# Patient Record
Sex: Female | Born: 1950 | Race: Black or African American | Hispanic: No | Marital: Married | Smoking: Former smoker
Health system: Southern US, Community
[De-identification: ages and names within clinical notes are randomized; demographics above are authoritative.]

## PROBLEM LIST (undated history)

## (undated) DIAGNOSIS — K219 Gastro-esophageal reflux disease without esophagitis: Secondary | ICD-10-CM

## (undated) DIAGNOSIS — E785 Hyperlipidemia, unspecified: Secondary | ICD-10-CM

## (undated) DIAGNOSIS — M069 Rheumatoid arthritis, unspecified: Secondary | ICD-10-CM

## (undated) DIAGNOSIS — E1169 Type 2 diabetes mellitus with other specified complication: Secondary | ICD-10-CM

## (undated) DIAGNOSIS — Z972 Presence of dental prosthetic device (complete) (partial): Secondary | ICD-10-CM

## (undated) DIAGNOSIS — I10 Essential (primary) hypertension: Secondary | ICD-10-CM

## (undated) HISTORY — PX: COLONOSCOPY: SHX174

---

## 2007-01-15 ENCOUNTER — Ambulatory Visit: Payer: Self-pay | Admitting: Family Medicine

## 2007-07-28 ENCOUNTER — Ambulatory Visit: Payer: Self-pay | Admitting: Gastroenterology

## 2008-06-30 ENCOUNTER — Ambulatory Visit: Payer: Self-pay | Admitting: Family Medicine

## 2008-07-20 ENCOUNTER — Ambulatory Visit: Payer: Self-pay | Admitting: Family Medicine

## 2009-07-21 ENCOUNTER — Ambulatory Visit: Payer: Self-pay | Admitting: Family Medicine

## 2010-07-26 ENCOUNTER — Ambulatory Visit: Payer: Self-pay | Admitting: Unknown Physician Specialty

## 2011-08-21 ENCOUNTER — Ambulatory Visit: Payer: Self-pay | Admitting: Unknown Physician Specialty

## 2012-03-04 ENCOUNTER — Other Ambulatory Visit: Payer: Self-pay | Admitting: Cardiology

## 2012-03-04 DIAGNOSIS — R0602 Shortness of breath: Secondary | ICD-10-CM

## 2012-03-05 ENCOUNTER — Other Ambulatory Visit: Payer: Self-pay

## 2012-03-05 ENCOUNTER — Other Ambulatory Visit (INDEPENDENT_AMBULATORY_CARE_PROVIDER_SITE_OTHER): Payer: BC Managed Care – PPO

## 2012-03-05 DIAGNOSIS — R0602 Shortness of breath: Secondary | ICD-10-CM

## 2012-03-10 ENCOUNTER — Encounter (HOSPITAL_COMMUNITY): Payer: Self-pay | Admitting: Internal Medicine

## 2012-11-17 ENCOUNTER — Ambulatory Visit: Payer: Self-pay | Admitting: Gastroenterology

## 2012-12-23 ENCOUNTER — Ambulatory Visit: Payer: Self-pay | Admitting: Internal Medicine

## 2013-06-18 IMAGING — MG MM CAD SCREENING MAMMO
1 series · 4 of 4 positions shown · non-contrast
Comparison: none

REASON FOR EXAM: SCR MAMMO NO ORDER
COMMENTS:

PROCEDURE:     MAM - MAM DGTL SCRN MAM NO ORDER W/CAD  - December 23, 2012  [DATE]
RESULT:     COMPARISON:  08/21/2011, 07/26/2010, 07/21/2009
TECHNIQUE: Digital screening mammograms were obtained. FDA approved
computer-aided detection (CAD) for mammography was utilized for this study.
BREAST COMPOSITION: The breast composition is SCATTERED FIBROGLANDULAR
TISSUE (glandular tissue is  25-50%)

[R CC · right · 4 of 4 slices shown]
[im 1/4]
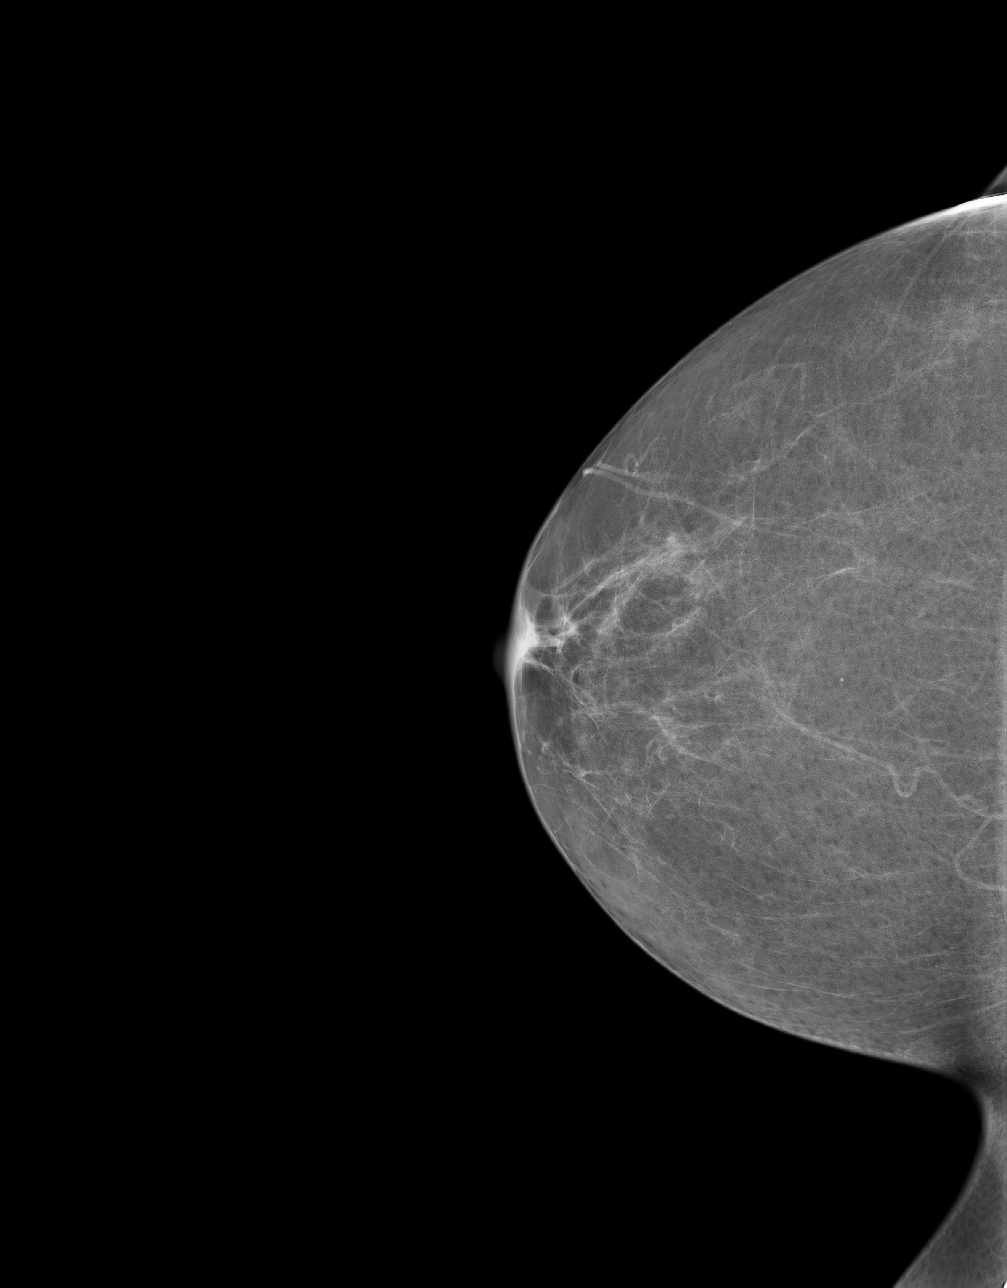
[im 2/4]
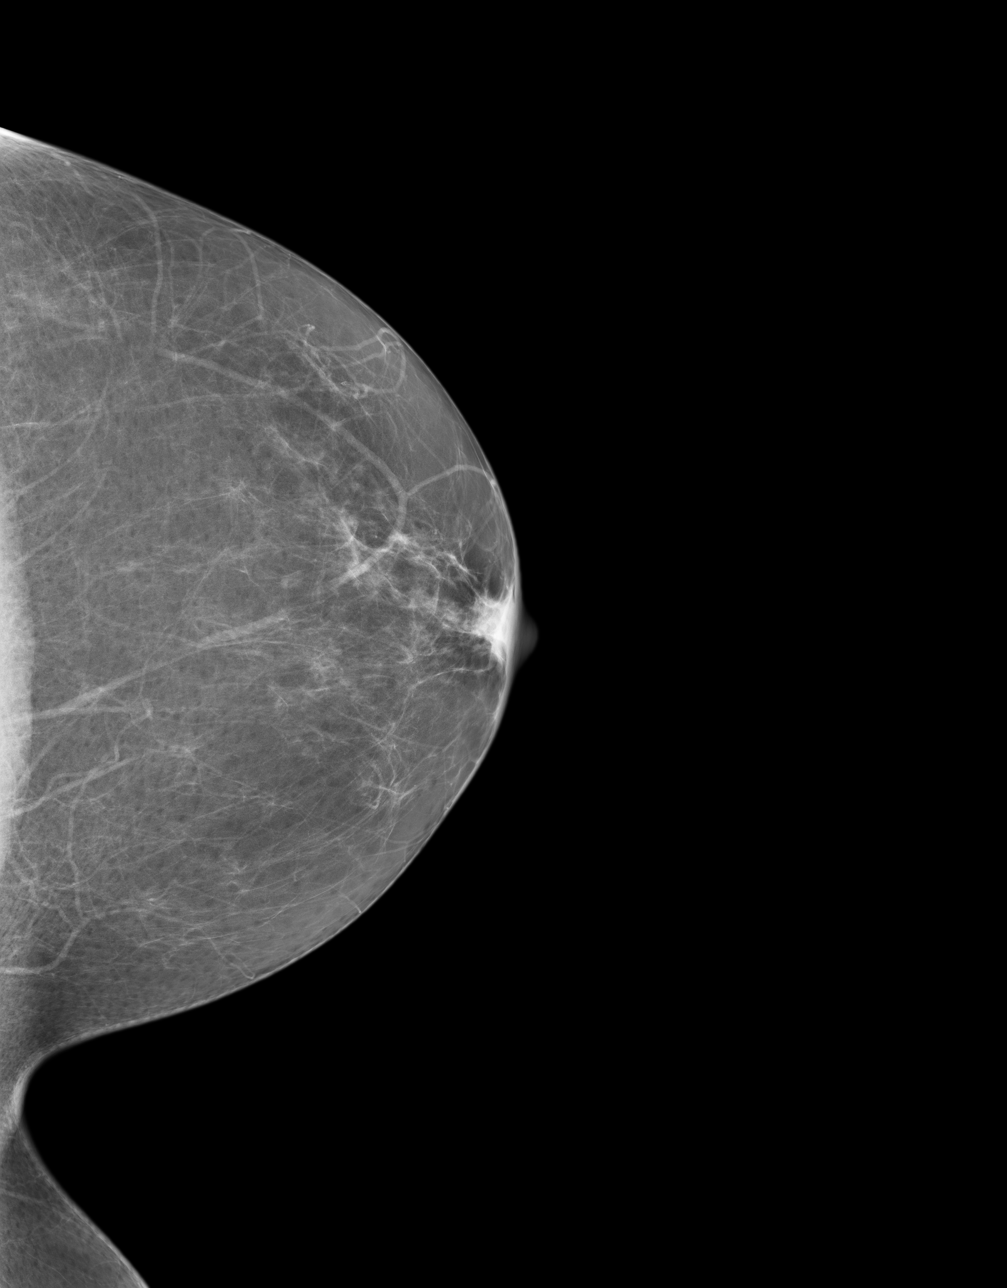
[im 3/4]
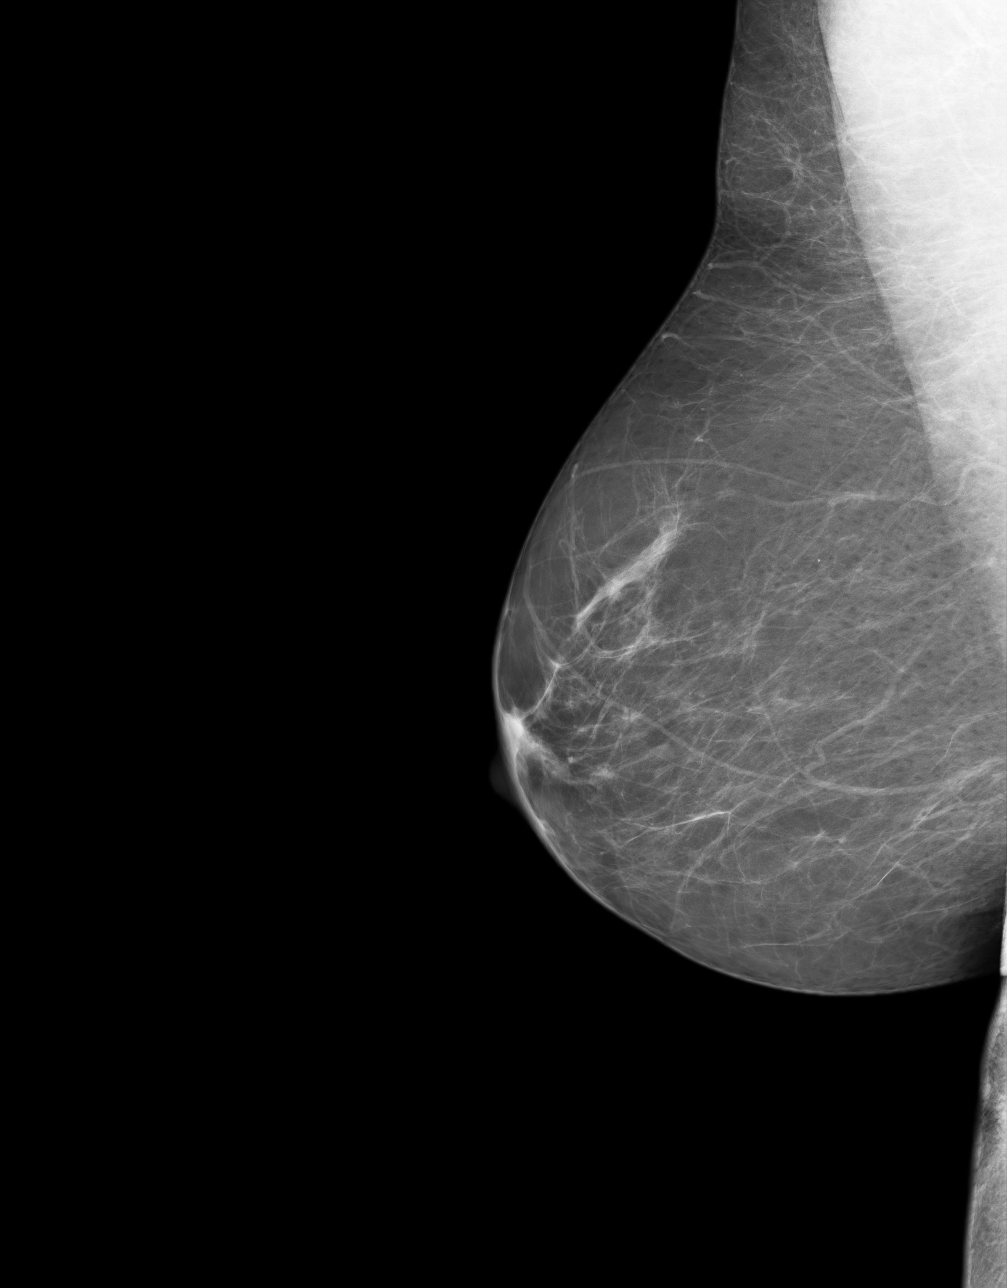
[im 4/4]
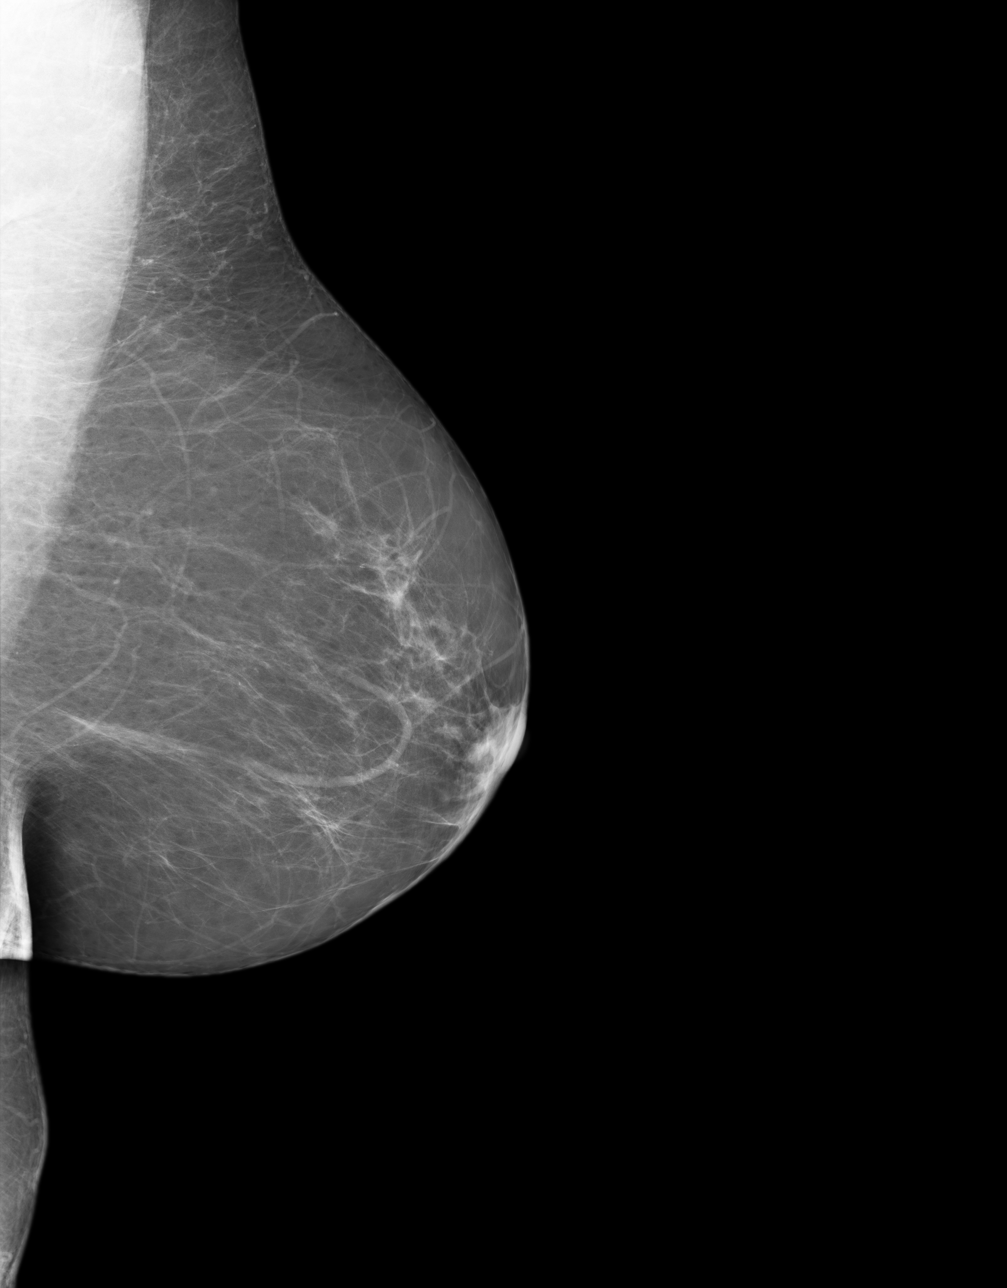

[4 of 4 positions shown; findings below may reference images not displayed]

FINDING: There is no dominant mass, architectural distortion or clusters of
suspicious microcalcifications.
IMPRESSION: 1.     Stable bilateral mammogram.
2.     Annual mammographic follow up recommended.

BI-RADS:  Category 1- Negative

A negative mammogram report does not preclude biopsy or other evaluation of
a clinically palpable or otherwise suspicious mass or lesion. Breast cancer
may not be detected by mammography in up to 10% of cases.

[REDACTED]

## 2016-12-17 ENCOUNTER — Other Ambulatory Visit: Payer: Self-pay | Admitting: Internal Medicine

## 2016-12-17 DIAGNOSIS — Z1239 Encounter for other screening for malignant neoplasm of breast: Secondary | ICD-10-CM

## 2017-01-04 ENCOUNTER — Ambulatory Visit
Admission: RE | Admit: 2017-01-04 | Discharge: 2017-01-04 | Disposition: A | Discharge status: Home / Self Care | Payer: Medicare Other | Source: Ambulatory Visit | Attending: Internal Medicine | Admitting: Internal Medicine

## 2017-01-04 ENCOUNTER — Encounter: Payer: Self-pay | Admitting: Radiology

## 2017-01-04 DIAGNOSIS — Z1231 Encounter for screening mammogram for malignant neoplasm of breast: Secondary | ICD-10-CM | POA: Insufficient documentation

## 2017-01-04 DIAGNOSIS — Z1239 Encounter for other screening for malignant neoplasm of breast: Secondary | ICD-10-CM

## 2017-04-19 ENCOUNTER — Other Ambulatory Visit: Payer: Self-pay | Admitting: Internal Medicine

## 2017-04-19 DIAGNOSIS — E049 Nontoxic goiter, unspecified: Secondary | ICD-10-CM

## 2017-04-26 ENCOUNTER — Ambulatory Visit: Payer: Medicare Other

## 2017-11-07 ENCOUNTER — Other Ambulatory Visit: Payer: Self-pay | Admitting: Internal Medicine

## 2017-11-07 DIAGNOSIS — Z1231 Encounter for screening mammogram for malignant neoplasm of breast: Secondary | ICD-10-CM

## 2018-01-07 ENCOUNTER — Ambulatory Visit
Admission: RE | Admit: 2018-01-07 | Discharge: 2018-01-07 | Disposition: A | Discharge status: Home / Self Care | Payer: Medicare Other | Source: Ambulatory Visit | Attending: Internal Medicine | Admitting: Internal Medicine

## 2018-01-07 DIAGNOSIS — Z1231 Encounter for screening mammogram for malignant neoplasm of breast: Secondary | ICD-10-CM | POA: Diagnosis present

## 2019-01-22 ENCOUNTER — Other Ambulatory Visit: Payer: Self-pay | Admitting: Family

## 2019-01-22 DIAGNOSIS — Z1231 Encounter for screening mammogram for malignant neoplasm of breast: Secondary | ICD-10-CM

## 2019-02-04 ENCOUNTER — Ambulatory Visit
Admission: RE | Admit: 2019-02-04 | Discharge: 2019-02-04 | Disposition: A | Discharge status: Home / Self Care | Payer: Medicare Other | Source: Ambulatory Visit | Attending: Family | Admitting: Family

## 2019-02-04 ENCOUNTER — Other Ambulatory Visit: Payer: Self-pay

## 2019-02-04 DIAGNOSIS — Z1231 Encounter for screening mammogram for malignant neoplasm of breast: Secondary | ICD-10-CM | POA: Insufficient documentation

## 2019-10-08 ENCOUNTER — Ambulatory Visit: Payer: Medicare Other | Attending: Internal Medicine

## 2019-10-08 DIAGNOSIS — Z23 Encounter for immunization: Secondary | ICD-10-CM | POA: Insufficient documentation

## 2019-10-08 NOTE — Progress Notes (Signed)
   Covid-19 Vaccination Clinic  Name:  Kathryn Mcdowell    MRN: [id number] DOB: April 06, 1951  10/08/2019  [name] was observed post Covid-19 immunization for 15 minutes without incidence. She was provided with Vaccine Information Sheet and instruction to access the V-Safe system.   [name] was instructed to call 911 with any severe reactions post vaccine: Marland Kitchen Difficulty breathing  . Swelling of your face and throat  . A fast heartbeat  . A bad rash all over your body  . Dizziness and weakness    Immunizations Administered    Name Date Dose VIS Date Route   Pfizer COVID-19 Vaccine 10/08/2019  9:03 AM 0.3 mL 07/24/2019 Intramuscular   Manufacturer: [place]   Lot: [id number]   [place]: [id number]

## 2019-11-04 ENCOUNTER — Ambulatory Visit: Payer: Medicare Other | Attending: Internal Medicine

## 2019-11-04 DIAGNOSIS — Z23 Encounter for immunization: Secondary | ICD-10-CM

## 2019-11-04 NOTE — Progress Notes (Signed)
   Covid-19 Vaccination Clinic  Name:  SHONICE CURLING    MRN: [id number] DOB: Aug 20, 1950  11/04/2019  Ms. Boler was observed post Covid-19 immunization for 15 minutes without incident. She was provided with Vaccine Information Sheet and instruction to access the V-Safe system.   Ms. Sperbeck was instructed to call 911 with any severe reactions post vaccine: Marland Kitchen Difficulty breathing  . Swelling of face and throat  . A fast heartbeat  . A bad rash all over body  . Dizziness and weakness   Immunizations Administered    Name Date Dose VIS Date Route   Pfizer COVID-19 Vaccine 11/04/2019  9:45 AM 0.3 mL 07/24/2019 Intramuscular   Manufacturer: [employer], [employer]   Lot: [id number]   [place]: [id number]

## 2020-01-07 ENCOUNTER — Other Ambulatory Visit: Payer: Self-pay | Admitting: Internal Medicine

## 2020-01-07 DIAGNOSIS — Z1231 Encounter for screening mammogram for malignant neoplasm of breast: Secondary | ICD-10-CM

## 2020-02-05 ENCOUNTER — Ambulatory Visit
Admission: RE | Admit: 2020-02-05 | Discharge: 2020-02-05 | Disposition: A | Discharge status: Home / Self Care | Payer: Medicare Other | Source: Ambulatory Visit | Attending: Internal Medicine | Admitting: Internal Medicine

## 2020-02-05 DIAGNOSIS — Z1231 Encounter for screening mammogram for malignant neoplasm of breast: Secondary | ICD-10-CM | POA: Insufficient documentation

## 2021-01-18 DIAGNOSIS — I10 Essential (primary) hypertension: Secondary | ICD-10-CM | POA: Diagnosis not present

## 2021-01-18 DIAGNOSIS — R7302 Impaired glucose tolerance (oral): Secondary | ICD-10-CM | POA: Diagnosis not present

## 2021-01-18 DIAGNOSIS — E782 Mixed hyperlipidemia: Secondary | ICD-10-CM | POA: Diagnosis not present

## 2021-01-19 ENCOUNTER — Other Ambulatory Visit: Payer: Self-pay | Admitting: Internal Medicine

## 2021-01-19 DIAGNOSIS — I10 Essential (primary) hypertension: Secondary | ICD-10-CM | POA: Diagnosis not present

## 2021-01-19 DIAGNOSIS — Z1231 Encounter for screening mammogram for malignant neoplasm of breast: Secondary | ICD-10-CM

## 2021-01-19 DIAGNOSIS — F32A Depression, unspecified: Secondary | ICD-10-CM | POA: Diagnosis not present

## 2021-01-19 DIAGNOSIS — R7302 Impaired glucose tolerance (oral): Secondary | ICD-10-CM | POA: Diagnosis not present

## 2021-01-19 DIAGNOSIS — E782 Mixed hyperlipidemia: Secondary | ICD-10-CM | POA: Diagnosis not present

## 2021-01-19 DIAGNOSIS — M8589 Other specified disorders of bone density and structure, multiple sites: Secondary | ICD-10-CM | POA: Diagnosis not present

## 2021-01-31 DIAGNOSIS — R29890 Loss of height: Secondary | ICD-10-CM | POA: Diagnosis not present

## 2021-02-06 ENCOUNTER — Other Ambulatory Visit: Payer: Self-pay

## 2021-02-06 ENCOUNTER — Ambulatory Visit
Admission: RE | Admit: 2021-02-06 | Discharge: 2021-02-06 | Disposition: A | Discharge status: Home / Self Care | Payer: Medicare Other | Source: Ambulatory Visit | Attending: Internal Medicine | Admitting: Internal Medicine

## 2021-02-06 DIAGNOSIS — Z1231 Encounter for screening mammogram for malignant neoplasm of breast: Secondary | ICD-10-CM | POA: Insufficient documentation

## 2021-02-09 ENCOUNTER — Other Ambulatory Visit: Payer: Self-pay

## 2021-02-09 ENCOUNTER — Emergency Department: Payer: Medicare Other

## 2021-02-09 ENCOUNTER — Encounter: Payer: Self-pay | Admitting: Emergency Medicine

## 2021-02-09 ENCOUNTER — Inpatient Hospital Stay
Admission: EM | Admit: 2021-02-09 | Discharge: 2021-02-11 | DRG: 546 | Disposition: A | Discharge status: Home / Self Care | Payer: Medicare Other | Attending: Family Medicine | Admitting: Family Medicine

## 2021-02-09 DIAGNOSIS — I700 Atherosclerosis of aorta: Secondary | ICD-10-CM | POA: Diagnosis not present

## 2021-02-09 DIAGNOSIS — Z87891 Personal history of nicotine dependence: Secondary | ICD-10-CM

## 2021-02-09 DIAGNOSIS — R6510 Systemic inflammatory response syndrome (SIRS) of non-infectious origin without acute organ dysfunction: Secondary | ICD-10-CM | POA: Diagnosis present

## 2021-02-09 DIAGNOSIS — I253 Aneurysm of heart: Secondary | ICD-10-CM | POA: Diagnosis present

## 2021-02-09 DIAGNOSIS — I422 Other hypertrophic cardiomyopathy: Secondary | ICD-10-CM | POA: Diagnosis not present

## 2021-02-09 DIAGNOSIS — Z7982 Long term (current) use of aspirin: Secondary | ICD-10-CM

## 2021-02-09 DIAGNOSIS — M25511 Pain in right shoulder: Secondary | ICD-10-CM | POA: Diagnosis present

## 2021-02-09 DIAGNOSIS — I421 Obstructive hypertrophic cardiomyopathy: Secondary | ICD-10-CM | POA: Diagnosis not present

## 2021-02-09 DIAGNOSIS — Z20822 Contact with and (suspected) exposure to covid-19: Secondary | ICD-10-CM | POA: Diagnosis not present

## 2021-02-09 DIAGNOSIS — R0682 Tachypnea, not elsewhere classified: Secondary | ICD-10-CM | POA: Diagnosis not present

## 2021-02-09 DIAGNOSIS — A419 Sepsis, unspecified organism: Secondary | ICD-10-CM | POA: Diagnosis not present

## 2021-02-09 DIAGNOSIS — R809 Proteinuria, unspecified: Secondary | ICD-10-CM | POA: Diagnosis not present

## 2021-02-09 DIAGNOSIS — R509 Fever, unspecified: Secondary | ICD-10-CM | POA: Diagnosis present

## 2021-02-09 DIAGNOSIS — M25519 Pain in unspecified shoulder: Secondary | ICD-10-CM

## 2021-02-09 DIAGNOSIS — R000 Tachycardia, unspecified: Secondary | ICD-10-CM | POA: Diagnosis present

## 2021-02-09 DIAGNOSIS — R3129 Other microscopic hematuria: Secondary | ICD-10-CM | POA: Diagnosis not present

## 2021-02-09 DIAGNOSIS — M353 Polymyalgia rheumatica: Secondary | ICD-10-CM | POA: Diagnosis not present

## 2021-02-09 DIAGNOSIS — M069 Rheumatoid arthritis, unspecified: Secondary | ICD-10-CM | POA: Diagnosis not present

## 2021-02-09 DIAGNOSIS — M0580 Other rheumatoid arthritis with rheumatoid factor of unspecified site: Secondary | ICD-10-CM | POA: Diagnosis not present

## 2021-02-09 DIAGNOSIS — R9431 Abnormal electrocardiogram [ECG] [EKG]: Secondary | ICD-10-CM | POA: Diagnosis not present

## 2021-02-09 DIAGNOSIS — Z79899 Other long term (current) drug therapy: Secondary | ICD-10-CM

## 2021-02-09 DIAGNOSIS — M25512 Pain in left shoulder: Secondary | ICD-10-CM | POA: Diagnosis present

## 2021-02-09 DIAGNOSIS — E049 Nontoxic goiter, unspecified: Secondary | ICD-10-CM | POA: Diagnosis present

## 2021-02-09 DIAGNOSIS — E785 Hyperlipidemia, unspecified: Secondary | ICD-10-CM | POA: Diagnosis not present

## 2021-02-09 DIAGNOSIS — I10 Essential (primary) hypertension: Secondary | ICD-10-CM | POA: Diagnosis not present

## 2021-02-09 DIAGNOSIS — D72829 Elevated white blood cell count, unspecified: Secondary | ICD-10-CM | POA: Diagnosis present

## 2021-02-09 DIAGNOSIS — E782 Mixed hyperlipidemia: Secondary | ICD-10-CM

## 2021-02-09 HISTORY — DX: Essential (primary) hypertension: I10

## 2021-02-09 HISTORY — DX: Hyperlipidemia, unspecified: E78.5

## 2021-02-09 LAB — COMPREHENSIVE METABOLIC PANEL
ALT: 38 U/L (ref 0–44)
AST: 43 U/L — ABNORMAL HIGH (ref 15–41)
Albumin: 4.2 g/dL (ref 3.5–5.0)
Alkaline Phosphatase: 55 U/L (ref 38–126)
Anion gap: 12 (ref 5–15)
BUN: 9 mg/dL (ref 8–23)
CO2: 21 mmol/L — ABNORMAL LOW (ref 22–32)
Calcium: 9.3 mg/dL (ref 8.9–10.3)
Chloride: 93 mmol/L — ABNORMAL LOW (ref 98–111)
Creatinine, Ser: 0.74 mg/dL (ref 0.44–1.00)
GFR, Estimated: >60 mL/min (ref >60)
Glucose, Bld: 175 mg/dL — ABNORMAL HIGH (ref 70–99)
Potassium: 3.4 mmol/L — ABNORMAL LOW (ref 3.5–5.1)
Sodium: 126 mmol/L — ABNORMAL LOW (ref 135–145)
Total Bilirubin: 1.3 mg/dL — ABNORMAL HIGH (ref 0.3–1.2)
Total Protein: 8.4 g/dL — ABNORMAL HIGH (ref 6.5–8.1)

## 2021-02-09 LAB — URINALYSIS, COMPLETE (UACMP) WITH MICROSCOPIC
Bilirubin Urine: NEGATIVE
Glucose, UA: NEGATIVE mg/dL
Ketones, ur: NEGATIVE mg/dL
Leukocytes,Ua: NEGATIVE
Nitrite: NEGATIVE
Protein, ur: 100 mg/dL — AB
Specific Gravity, Urine: 1.004 — ABNORMAL LOW (ref 1.005–1.030)
Squamous Epithelial / HPF: NONE SEEN (ref 0–5)
pH: 6 (ref 5.0–8.0)

## 2021-02-09 LAB — CBC WITH DIFFERENTIAL/PLATELET
Abs Immature Granulocytes: 0.14 10*3/uL — ABNORMAL HIGH (ref 0.00–0.07)
Basophils Absolute: 0 10*3/uL (ref 0.0–0.1)
Basophils Relative: 0 %
Eosinophils Absolute: 0 10*3/uL (ref 0.0–0.5)
Eosinophils Relative: 0 %
HCT: 32.3 % — ABNORMAL LOW (ref 36.0–46.0)
Hemoglobin: 11.7 g/dL — ABNORMAL LOW (ref 12.0–15.0)
Immature Granulocytes: 1 %
Lymphocytes Relative: 5 %
Lymphs Abs: 1 10*3/uL (ref 0.7–4.0)
MCH: 33.6 pg (ref 26.0–34.0)
MCHC: 36.2 g/dL — ABNORMAL HIGH (ref 30.0–36.0)
MCV: 92.8 fL (ref 80.0–100.0)
Monocytes Absolute: 1.9 10*3/uL — ABNORMAL HIGH (ref 0.1–1.0)
Monocytes Relative: 9 %
Neutro Abs: 18.7 10*3/uL — ABNORMAL HIGH (ref 1.7–7.7)
Neutrophils Relative %: 85 %
Platelets: 247 10*3/uL (ref 150–400)
RBC: 3.48 MIL/uL — ABNORMAL LOW (ref 3.87–5.11)
RDW: 13 % (ref 11.5–15.5)
WBC: 21.7 10*3/uL — ABNORMAL HIGH (ref 4.0–10.5)
nRBC: 0 % (ref 0.0–0.2)

## 2021-02-09 LAB — TROPONIN I (HIGH SENSITIVITY)
Troponin I (High Sensitivity): 11 ng/L (ref <18)
Troponin I (High Sensitivity): 13 ng/L (ref <18)

## 2021-02-09 LAB — T4, FREE: Free T4: 0.96 ng/dL (ref 0.61–1.12)

## 2021-02-09 LAB — TSH: TSH: 0.439 u[IU]/mL (ref 0.350–4.500)

## 2021-02-09 LAB — PROCALCITONIN: Procalcitonin: 0.21 ng/mL

## 2021-02-09 LAB — RESP PANEL BY RT-PCR (FLU A&B, COVID) ARPGX2
Influenza A by PCR: NEGATIVE
Influenza B by PCR: NEGATIVE
SARS Coronavirus 2 by RT PCR: NEGATIVE

## 2021-02-09 LAB — SEDIMENTATION RATE: Sed Rate: 70 mm/hr — ABNORMAL HIGH (ref 0–30)

## 2021-02-09 LAB — LACTIC ACID, PLASMA
Lactic Acid, Venous: 2.4 mmol/L (ref 0.5–1.9)
Lactic Acid, Venous: 2.3 mmol/L (ref 0.5–1.9)

## 2021-02-09 MED ORDER — SODIUM CHLORIDE 0.9 % IV BOLUS
1000.0000 mL | Freq: Once | INTRAVENOUS | Status: AC
  Administered 2021-02-09: 1000 mL via INTRAVENOUS

## 2021-02-09 MED ORDER — CEFEPIME HCL 2 G IJ SOLR
2.0000 g | Freq: Once | INTRAMUSCULAR | Status: AC
  Administered 2021-02-09: 2 g via INTRAVENOUS
  Filled 2021-02-09: qty 2

## 2021-02-09 MED ORDER — VANCOMYCIN HCL IN DEXTROSE 1-5 GM/200ML-% IV SOLN: 1000.0000 mg | Freq: Once | INTRAVENOUS | Status: DC

## 2021-02-09 MED ORDER — VANCOMYCIN HCL 1750 MG/350ML IV SOLN
1750.0000 mg | Freq: Once | INTRAVENOUS | Status: AC
  Administered 2021-02-09: 1750 mg via INTRAVENOUS
  Filled 2021-02-09: qty 350

## 2021-02-09 MED ORDER — ATORVASTATIN CALCIUM 20 MG PO TABS
20.0000 mg | ORAL_TABLET | Freq: Every day | ORAL | Status: DC
  Administered 2021-02-10: 20 mg via ORAL
  Filled 2021-02-09 (×2): qty 1

## 2021-02-09 MED ORDER — VANCOMYCIN HCL IN DEXTROSE 1-5 GM/200ML-% IV SOLN: 1000.0000 mg | INTRAVENOUS | Status: DC

## 2021-02-09 MED ORDER — METRONIDAZOLE 500 MG/100ML IV SOLN
500.0000 mg | Freq: Once | INTRAVENOUS | Status: AC
  Administered 2021-02-09: 500 mg via INTRAVENOUS
  Filled 2021-02-09 (×2): qty 100

## 2021-02-09 MED ORDER — ASPIRIN EC 81 MG PO TBEC
81.0000 mg | DELAYED_RELEASE_TABLET | Freq: Every day | ORAL | Status: DC
  Administered 2021-02-10 – 2021-02-11 (×2): 81 mg via ORAL
  Filled 2021-02-09 (×2): qty 1

## 2021-02-09 MED ORDER — ENOXAPARIN SODIUM 40 MG/0.4ML IJ SOSY
40.0000 mg | PREFILLED_SYRINGE | INTRAMUSCULAR | Status: DC
  Administered 2021-02-09 – 2021-02-10 (×2): 40 mg via SUBCUTANEOUS
  Filled 2021-02-09 (×2): qty 0.4

## 2021-02-09 MED ORDER — ACETAMINOPHEN 325 MG PO TABS
650.0000 mg | ORAL_TABLET | Freq: Once | ORAL | Status: AC | PRN
  Administered 2021-02-09: 650 mg via ORAL

## 2021-02-09 MED ORDER — ACETAMINOPHEN 325 MG PO TABS
650.0000 mg | ORAL_TABLET | Freq: Four times a day (QID) | ORAL | Status: DC | PRN
  Administered 2021-02-09 – 2021-02-11 (×3): 650 mg via ORAL
  Filled 2021-02-09 (×3): qty 2

## 2021-02-09 MED ORDER — METRONIDAZOLE 500 MG/100ML IV SOLN
500.0000 mg | Freq: Three times a day (TID) | INTRAVENOUS | Status: DC
  Administered 2021-02-10: 500 mg via INTRAVENOUS
  Filled 2021-02-09 (×2): qty 100

## 2021-02-09 MED ORDER — SODIUM CHLORIDE 0.9 % IV SOLN
2.0000 g | Freq: Three times a day (TID) | INTRAVENOUS | Status: DC
  Administered 2021-02-10 (×2): 2 g via INTRAVENOUS
  Filled 2021-02-09 (×2): qty 2

## 2021-02-09 MED ORDER — SODIUM CHLORIDE 0.9 % IV SOLN: INTRAVENOUS | Status: AC

## 2021-02-09 MED ORDER — LOSARTAN POTASSIUM 50 MG PO TABS
100.0000 mg | ORAL_TABLET | Freq: Every day | ORAL | Status: DC
  Administered 2021-02-10 – 2021-02-11 (×2): 100 mg via ORAL
  Filled 2021-02-09 (×2): qty 2

## 2021-02-09 MED ORDER — ONDANSETRON HCL 4 MG/2ML IJ SOLN: 4.0000 mg | Freq: Four times a day (QID) | INTRAMUSCULAR | Status: DC | PRN

## 2021-02-09 MED ORDER — ACETAMINOPHEN 650 MG RE SUPP: 650.0000 mg | Freq: Four times a day (QID) | RECTAL | Status: DC | PRN

## 2021-02-09 MED ORDER — AMLODIPINE BESYLATE 5 MG PO TABS
5.0000 mg | ORAL_TABLET | Freq: Every day | ORAL | Status: DC
  Administered 2021-02-10 – 2021-02-11 (×2): 5 mg via ORAL
  Filled 2021-02-09 (×2): qty 1

## 2021-02-09 MED ORDER — HYDROCHLOROTHIAZIDE 25 MG PO TABS
25.0000 mg | ORAL_TABLET | Freq: Every day | ORAL | Status: DC
  Administered 2021-02-10 – 2021-02-11 (×2): 25 mg via ORAL
  Filled 2021-02-09 (×2): qty 1

## 2021-02-09 MED ORDER — ONDANSETRON HCL 4 MG PO TABS: 4.0000 mg | ORAL_TABLET | Freq: Four times a day (QID) | ORAL | Status: DC | PRN

## 2021-02-09 MED ORDER — ACETAMINOPHEN 325 MG PO TABS
ORAL_TABLET | ORAL | Status: AC
  Administered 2021-02-09: 650 mg via ORAL
  Filled 2021-02-09: qty 2

## 2021-02-09 MED ORDER — KETOROLAC TROMETHAMINE 30 MG/ML IJ SOLN
30.0000 mg | Freq: Three times a day (TID) | INTRAMUSCULAR | Status: DC | PRN
  Administered 2021-02-10: 30 mg via INTRAVENOUS
  Filled 2021-02-09: qty 1

## 2021-02-09 MED ORDER — LIDOCAINE 5 % EX PTCH
1.0000 | MEDICATED_PATCH | CUTANEOUS | Status: DC
  Administered 2021-02-09: 1 via TRANSDERMAL
  Filled 2021-02-09 (×2): qty 1

## 2021-02-09 NOTE — Consult Note (Signed)
PHARMACY -  BRIEF ANTIBIOTIC NOTE   Pharmacy has received consult(s) for vancomycin and cefepime from an ED provider.  The patient's profile has been reviewed for ht/wt/allergies/indication/available labs.    One time order(s) placed for cefepime 2 g and vancomycin 1750 mg   Further antibiotics/pharmacy consults should be ordered by admitting physician if indicated.                       Thank you, Darnelle Bos, PharmD 02/09/2021  4:02 PM

## 2021-02-09 NOTE — ED Notes (Signed)
Pt requested to start taking lipitor tomorrow night, pt placed on hospital bed at this time.

## 2021-02-09 NOTE — Sepsis Progress Note (Signed)
Sepsis protocol followed by eLink 

## 2021-02-09 NOTE — ED Notes (Signed)
This RN agrees with triage assessment  Pt denies chest pain or any SOB during the timeframe her symptoms started.

## 2021-02-09 NOTE — ED Triage Notes (Addendum)
Pt comes into the ED via POV c/o bilateral shoulder pain that she woke up with on Tuesday.  Pt denies any known injury to the area but thinks she may have "slept wrong" on the shoulder.  Pt reports limited mobility without pain.  Pt in NAD at this time and is ambulatory to triage.  Pt denies any known h/o arthritis in the joints. Pt does present febrile and tachycardic.  Pt states she hasn't felt "well" but denies any cough, difficulty urinating, abdominal pain, etc.  Pt explains she has been very fatigue and weaker than normal.

## 2021-02-09 NOTE — H&P (Signed)
History and Physical   Kathryn Mcdowell:[id number] DOB: 1950/12/07 DOA: 02/09/2021  PCP: Perrin Maltese, MD  Outpatient Specialists: Dr. Alice Reichert Patient coming from: home via [place]  I have personally briefly reviewed patient's old medical records in [place].  Chief Concern: Bilateral shoulder pain  HPI: Kathryn Mcdowell is a 70 y.o. female with medical history significant for hypertension and hyperlipidemia presents to the emergency via POV for chief concern of bilateral shoulder pain that started on Tuesday.  She reports the pain is 10/10, pin and needles, and constant. She reports she was asleep when this happened. She reports the pain is worse with movement.  She states nothing makes it feel better.  She states she has never felt this way before.   She denies trauma to her person.  She denies bites.  Social history: lives with husband, married for 40 years. She is a former tobacco user and smoked about 1 ppd and quit 20 years ago. She drinks 2 etoh drinks per week. She denies recreational drugs use. She is retired and formerly worked as a Education officer, museum.   Vaccination history: She is vaccinated for covid 19, with 3 doses, Pfizer  ROS: Constitutional: no weight change, no fever ENT/Mouth: no sore throat, no rhinorrhea Eyes: no eye pain, no vision changes Cardiovascular: no chest pain, no dyspnea,  no edema, no palpitations Respiratory: no cough, no sputum, no wheezing Gastrointestinal: no nausea, no vomiting, no diarrhea, no constipation Genitourinary: no urinary incontinence, no dysuria, no hematuria Musculoskeletal: + arthralgias, no myalgias Skin: no skin lesions, no pruritus, Neuro: + weakness, no loss of consciousness, no syncope Psych: no anxiety, no depression, + decrease appetite Heme/Lymph: no bruising, no bleeding  ED Course: Discussed with EDP, patient requiring hospitalization due to meeting sepsis criteria.  Vitals in the emergency department was remarkable  for temperature of 100.6, respiration rate of 18, heart rate of 110, blood pressure initially 163/67.  Labs in the emergency department was remarkable for WBC 21.7, hemoglobin 11.7, platelets 247, sodium 126, chloride 93, potassium 3.4, BUN 9, serum creatinine 0.74, EGFR greater than 60, nonfasting blood glucose 175.  Lactic acid 2.4 and decreased to 2.3  Pro-Cal elevated 0.21  COVID was negative  ED provider gave patient 1 dose of Tylenol 325, normal saline 1 L bolus, lidocaine patch, and broad-spectrum antibiotics including cefepime, metronidazole, vancomycin  Assessment/Plan  Active Problems:   Sepsis ([place])   Essential hypertension   Hyperlipidemia   # Patient's meet sepsis criteria with elevated heart rate, WBC, increased lactic acid - Etiology in progress at this time - Continue broad-spectrum antibiotics - Patient did not have any dysuria or hematuria-UA was negative for nitrates and leukocytes - Blood cultures x2 in progress - Patient maintaining MAP no indication for further bolus or IVF at this time  # Bilateral shoulder joints with pain with movement - Bilateral shoulder x-ray were negative - No overt erythematous - Rheumatoid factor and CCP for AM labs ordered -Toradol 30 mg every 8 hours as needed for moderate pain and joint pain - Normal saline 125 mL/h, 8 hours, to complete 1 L bag  # Hypertension - Resumed amlodipine 5 mg daily, hydrochlorothiazide 25 mg daily, losartan 100 mg daily  # Hyperlipidemia-atorvastatin 20 mg nightly resumed  Chart reviewed.   DVT prophylaxis: Enoxaparin 40 mg subcutaneous Code Status: Full code Diet: Heart healthy Family Communication: No Disposition Plan: Pending clinical course Consults called: No Admission status: MedSurg, observation, 24 hours telemetry ordered  Past Medical History:  Diagnosis Date   Hyperlipidemia    Hypertension    History reviewed. No pertinent surgical history.  Social History:  reports that  she has never smoked. She has never used smokeless tobacco. She reports current alcohol use. No history on file for drug use.  No Known Allergies Family History  Problem Relation Age of Onset   Breast cancer Neg Hx    Family history: Family history reviewed and not pertinent  Prior to Admission medications   Not on File   Physical Exam: Vitals:   02/09/21 2030 02/09/21 2030 02/09/21 2100 02/09/21 2130  BP: 101/61  138/69 (!) 156/73  Pulse: (!) 108  (!) 105 96  Resp: (!) 24  19 (!) 26  Temp:  99.6 F (37.6 C)    TempSrc:  Oral    SpO2: 97%  94% 95%  Weight:      Height:       Constitutional: appears older than chronological age, NAD, calm, comfortable Eyes: PERRL, lids and conjunctivae normal ENMT: Mucous membranes are moist. Posterior pharynx clear of any exudate or lesions. Age-appropriate dentition. Hearing appropriate Neck: normal, supple, no masses, no thyromegaly Respiratory: clear to auscultation bilaterally, no wheezing, no crackles. Normal respiratory effort. No accessory muscle use.  Cardiovascular: Regular rate and rhythm, no murmurs / rubs / gallops. No extremity edema. 2+ pedal pulses. No carotid bruits.  Abdomen: Obese abdomen, no tenderness, no masses palpated, no hepatosplenomegaly. Bowel sounds positive.  Musculoskeletal: no clubbing / cyanosis. No joint deformity upper and lower extremities. Good ROM, no contractures, no atrophy. Normal muscle tone.  Bilateral shoulder joint tenderness and worse with movement Skin: no rashes, lesions, ulcers. No induration Neurologic: Sensation intact. Strength 5/5 in all 4.  Psychiatric: Normal judgment and insight. Alert and oriented x 3. Normal mood.   EKG: independently reviewed, showing sinus tachycardia with rate of 105, QTc 489  Chest x-ray on Admission: I personally reviewed and I agree with radiologist reading as below.  DG Chest 2 View  Result Date: 02/09/2021 CLINICAL DATA:  Bilateral shoulder pain EXAM: CHEST -  2 VIEW COMPARISON:  None. FINDINGS: Cardiac shadow is within normal limits. Aortic calcifications are seen. The lungs are clear. No bony abnormality is noted. IMPRESSION: No active cardiopulmonary disease. Electronically Signed   By: Inez Catalina M.D.   On: 02/09/2021 15:01   DG Shoulder Right  Result Date: 02/09/2021 CLINICAL DATA:  Acute bilateral shoulder pain. EXAM: RIGHT SHOULDER - 2+ VIEW COMPARISON:  None. FINDINGS: There is no evidence of fracture or dislocation. There is no evidence of arthropathy or other focal bone abnormality. Soft tissues are unremarkable. IMPRESSION: Negative. Electronically Signed   By: Marijo Conception M.D.   On: 02/09/2021 16:44   DG Shoulder Left  Result Date: 02/09/2021 CLINICAL DATA:  Acute bilateral shoulder pain. EXAM: LEFT SHOULDER - 2+ VIEW COMPARISON:  None. FINDINGS: There is no evidence of fracture or dislocation. There is no evidence of arthropathy or other focal bone abnormality. Soft tissues are unremarkable. IMPRESSION: Negative. Electronically Signed   By: Marijo Conception M.D.   On: 02/09/2021 16:43    Labs on Admission: I have personally reviewed following labs  CBC: Recent Labs  Lab 02/09/21 1422  WBC 21.7*  NEUTROABS 18.7*  HGB 11.7*  HCT 32.3*  MCV 92.8  PLT 597   Basic Metabolic Panel: Recent Labs  Lab 02/09/21 1422  NA 126*  K 3.4*  CL 93*  CO2 21*  GLUCOSE  175*  BUN 9  CREATININE 0.74  CALCIUM 9.3   GFR: Estimated Creatinine Clearance: 65.6 mL/min (by C-G formula based on SCr of 0.74 mg/dL).  Liver Function Tests: Recent Labs  Lab 02/09/21 1422  AST 43*  ALT 38  ALKPHOS 55  BILITOT 1.3*  PROT 8.4*  ALBUMIN 4.2   Thyroid Function Tests: Recent Labs    02/09/21 1422  TSH 0.439  FREET4 0.96   Dr. Tobie Poet [facility]  If 7PM-7AM, please contact overnight-coverage provider If 7AM-7PM, please contact day coverage provider [email or web address]  02/09/2021, 10:26 PM

## 2021-02-09 NOTE — ED Notes (Signed)
Lab called to assist with alb draw

## 2021-02-09 NOTE — ED Provider Notes (Signed)
[facility] Emergency Department Provider Note  ____________________________________________   Event Date/Time   First MD Initiated Contact with Patient 02/09/21 1523     (approximate)  I have reviewed the triage vital signs and the nursing notes.   HISTORY  Chief Complaint Shoulder Pain, Fever, and Weakness    HPI Kathryn Mcdowell is a 70 y.o. female with hypertension, hyperlipidemia who comes in with bilateral shoulder pain.  Patient reports worsening pain in the right shoulder and the left but still having pain in both.  She states the pain is just on the shoulder.  She is limited range of motion secondary to pain.  She is wonders if she just slept on it wrong and she pulled a muscle.  Patient was febrile and tachycardic in triage and denies realizing this.  She denies any cough, difficulties urinating, abdominal pain, rashes.  Denies any tick bites, denies any recent travel, recent surgeries.  Denies any dental issues.  Denies any metal plates in her.            Past Medical History:  Diagnosis Date   Hyperlipidemia    Hypertension     There are no problems to display for this patient.   History reviewed. No pertinent surgical history.  Prior to Admission medications   Not on File    Allergies Patient has no known allergies.  Family History  Problem Relation Age of Onset   Breast cancer Neg Hx     Social History Social History   Tobacco Use   Smoking status: Never   Smokeless tobacco: Never  Substance Use Topics   Alcohol use: Yes      Review of Systems Constitutional: Positive fever Eyes: No visual changes. ENT: No sore throat. Cardiovascular: Denies chest pain. Respiratory: Denies shortness of breath. Gastrointestinal: No abdominal pain.  No nausea, no vomiting.  No diarrhea.  No constipation. Genitourinary: Negative for dysuria. Musculoskeletal: Bilateral shoulder pain Skin: Negative for rash. Neurological:  Negative for headaches, focal weakness or numbness. All other ROS negative ____________________________________________   PHYSICAL EXAM:  VITAL SIGNS: ED Triage Vitals  Enc Vitals Group     BP 02/09/21 1407 (!) 163/67     Pulse Rate 02/09/21 1407 (!) 110     Resp 02/09/21 1407 18     Temp 02/09/21 1407 (!) 100.6 F (38.1 C)     Temp Source 02/09/21 1407 Oral     SpO2 02/09/21 1407 97 %     Weight 02/09/21 1409 172 lb (78 kg)     Height 02/09/21 1409 5\' 3"  (1.6 m)     Head Circumference --      Peak Flow --      Pain Score 02/09/21 1409 10     Pain Loc --      Pain Edu? --      Excl. in [place]? --     Constitutional: Alert and oriented. Well appearing and in no acute distress. Eyes: Conjunctivae are normal. EOMI. Head: Atraumatic. Nose: No congestion/rhinnorhea. Mouth/Throat: Mucous membranes are moist.   Neck: No stridor. Trachea Midline. FROM Cardiovascular: Normal rate, regular rhythm. Grossly normal heart sounds.  Good peripheral circulation. Respiratory: Normal respiratory effort.  No retractions. Lungs CTAB. Gastrointestinal: Soft and nontender. No distention. No abdominal bruits.  Musculoskeletal: 2+ distal pulses bilaterally without any swelling noted.  She reports tenderness in the right shoulder and the left shoulder.  Worsening tenderness with movement.  Even with passive movement patient reports pain more  on the right than the left shoulder Neurologic:  Normal speech and language. No gross focal neurologic deficits are appreciated.  Skin:  Skin is warm, dry and intact. No rash noted. Psychiatric: Mood and affect are normal. Speech and behavior are normal. GU: Deferred   ____________________________________________   LABS (all labs ordered are listed, but only abnormal results are displayed)  Labs Reviewed  LACTIC ACID, PLASMA - Abnormal; Notable for the following components:      Result Value   Lactic Acid, Venous 2.4 (*)    All other components within normal  limits  COMPREHENSIVE METABOLIC PANEL - Abnormal; Notable for the following components:   Sodium 126 (*)    Potassium 3.4 (*)    Chloride 93 (*)    CO2 21 (*)    Glucose, Bld 175 (*)    Total Protein 8.4 (*)    AST 43 (*)    Total Bilirubin 1.3 (*)    All other components within normal limits  CBC WITH DIFFERENTIAL/PLATELET - Abnormal; Notable for the following components:   WBC 21.7 (*)    RBC 3.48 (*)    Hemoglobin 11.7 (*)    HCT 32.3 (*)    MCHC 36.2 (*)    Neutro Abs 18.7 (*)    Monocytes Absolute 1.9 (*)    Abs Immature Granulocytes 0.14 (*)    All other components within normal limits  RESP PANEL BY RT-PCR (FLU A&B, COVID) ARPGX2  LACTIC ACID, PLASMA  URINALYSIS, COMPLETE (UACMP) WITH MICROSCOPIC  TSH  T4, FREE  PROCALCITONIN  TROPONIN I (HIGH SENSITIVITY)   ____________________________________________   ED ECG REPORT I, Vanessa [place], the attending physician, personally viewed and interpreted this ECG.  Sinus tachycardia rate of 105, no ST elevations, T wave inversions in 1 aVL V2 V4 V5 with ST elevation in aVR.  No prior EKG to compare to ____________________________________________  RADIOLOGY I, Vanessa [place], personally viewed and evaluated these images (plain radiographs) as part of my medical decision making, as well as reviewing the written report by the radiologist.  ED MD interpretation: No pneumonia  Official radiology report(s): DG Chest 2 View  Result Date: 02/09/2021 CLINICAL DATA:  Bilateral shoulder pain EXAM: CHEST - 2 VIEW COMPARISON:  None. FINDINGS: Cardiac shadow is within normal limits. Aortic calcifications are seen. The lungs are clear. No bony abnormality is noted. IMPRESSION: No active cardiopulmonary disease. Electronically Signed   By: Inez Catalina M.D.   On: 02/09/2021 15:01    ____________________________________________   PROCEDURES  Procedure(s) performed (including Critical Care):  .1-3 Lead EKG Interpretation  Date/Time:  02/09/2021 5:42 PM Performed by: Vanessa [place], MD Authorized by: Vanessa [place], MD     Interpretation: abnormal     ECG rate:  100s=120s   ECG rate assessment: tachycardic     Rhythm: sinus tachycardia     Ectopy: none     Conduction: normal   .Critical Care  Date/Time: 02/09/2021 5:42 PM Performed by: Vanessa [place], MD Authorized by: Vanessa , MD   Critical care provider statement:    Critical care time (minutes):  35   Critical care was necessary to treat or prevent imminent or life-threatening deterioration of the following conditions:  Sepsis   Critical care was time spent personally by me on the following activities:  Discussions with consultants, evaluation of patient's response to treatment, examination of patient, ordering and performing treatments and interventions, ordering and review of laboratory studies, ordering and review of  radiographic studies, pulse oximetry, re-evaluation of patient's condition, obtaining history from patient or surrogate and review of old charts   ____________________________________________   INITIAL IMPRESSION / ASSESSMENT AND PLAN / ED COURSE  Kathryn Mcdowell was evaluated in Emergency Department on 02/09/2021 for the symptoms described in the history of present illness. She was evaluated in the context of the global COVID-19 pandemic, which necessitated consideration that the patient might be at risk for infection with the SARS-CoV-2 virus that causes COVID-19. Institutional protocols and algorithms that pertain to the evaluation of patients at risk for COVID-19 are in a state of rapid change based on information released by regulatory bodies including the CDC and federal and state organizations. These policies and algorithms were followed during the patient's care in the ED.    Patient is a 70 year old who comes in with shoulder pain bilaterally.  Seems more musculoskeletal in nature but patient is significantly febrile, tachycardic.  Labs  ordered to evaluate for Electra abn, AKI, UTI, chest x-ray to rule out pneumonia.  Lactate was ordered which was slightly elevated so we will give some fluids.  Will add on blood cultures to evaluate for bacteremia.  My examination she is limited range of motion more on the right than the left shoulder.  1 does not feel significantly warmer than the other there is no rash or erythema over her body.  No foot infections.  Her abdomen is soft and nontender.  Upon patient getting to her room I did do a sepsis alert given her tachycardia, fever and elevated white count.  Unclear source.  I am concerned about the possibility of bacteremia.  Her procalcitonin is slightly elevated.  I do not think she has a septic joint she has no risk factors for that and her joint does not feel warm or red.  X-rays were ordered that did not show any signs of effusion or anything like that.  Will admit to the hospital team for sepsis of unclear origin  On repeat evaluation her heart rates have come down her blood pressures are stable.  Does not need pressors.         ____________________________________________   FINAL CLINICAL IMPRESSION(S) / ED DIAGNOSES   Final diagnoses:  Shoulder pain  Sepsis, due to unspecified organism, unspecified whether acute organ dysfunction present ([place])      MEDICATIONS GIVEN DURING THIS VISIT:  Medications  acetaminophen (TYLENOL) 325 MG tablet (has no administration in time range)  metroNIDAZOLE (FLAGYL) IVPB 500 mg (has no administration in time range)  lidocaine (LIDODERM) 5 % 1 patch (1 patch Transdermal Patch Applied 02/09/21 1719)  vancomycin (VANCOREADY) IVPB 1750 mg/350 mL (has no administration in time range)  acetaminophen (TYLENOL) tablet 650 mg (650 mg Oral Given 02/09/21 1417)  sodium chloride 0.9 % bolus 1,000 mL (1,000 mLs Intravenous New Bag/Given 02/09/21 1608)  ceFEPIme (MAXIPIME) 2 g in sodium chloride 0.9 % 100 mL IVPB (2 g Intravenous New Bag/Given  02/09/21 1705)     ED Discharge Orders     None        Note:  This document was prepared using Dragon voice recognition software and may include unintentional dictation errors.    Vanessa [place], MD 02/09/21 [phone]

## 2021-02-09 NOTE — Consult Note (Signed)
CODE SEPSIS - PHARMACY COMMUNICATION  **Broad Spectrum Antibiotics should be administered within 1 hour of Sepsis diagnosis**  Time Code Sepsis Called/Page Received: 1548  Antibiotics Ordered: 6815  Time of 1st antibiotic administration: 1705  Additional action taken by pharmacy: N/A  If necessary, Name of Provider/Nurse Contacted: N/A    Darnelle Bos ,PharmD Clinical Pharmacist  02/09/2021  3:58 PM

## 2021-02-09 NOTE — Consult Note (Signed)
Pharmacy Antibiotic Note  Kathryn Mcdowell is a 70 y.o. female admitted on 02/09/2021 with Bilateral should pain, fever, and tachycardia.  Pharmacy has been consulted for Vancomycin and cefepime dosing for sepsis  Plan: Initiate Cefepime 2 gram Q8H Vancomycin 1750 mg LD x 1 ordered, followed by Vancomycin 1000 mg Q24H. Goal AUC 400-550 Expected AUC: 513 Scr used: 0.8 Vd used: 0.5 Follow up culture for de-escalation Monitor renal function   Height: 5\' 3"  (160 cm) Weight: 78 kg (172 lb) IBW/kg (Calculated) : 52.4  Temp (24hrs), Avg:100.1 F (37.8 C), Min:99.6 F (37.6 C), Max:100.6 F (38.1 C)  Recent Labs  Lab 02/09/21 1422 02/09/21 1750  WBC 21.7*  --   CREATININE 0.74  --   LATICACIDVEN 2.4* 2.3*    Estimated Creatinine Clearance: 65.6 mL/min (by C-G formula based on SCr of 0.74 mg/dL).    No Known Allergies  Antimicrobials this admission: Metronidazole 6/30 >>  Cefepime 6/30  >>  Vancomycin 6/30 >>  Dose adjustments this admission: N/a  Microbiology results: 6/30 BCx: sent   Thank you for allowing pharmacy to be a part of this patient's care.  Dorothe Pea, PharmD, BCPS Clinical Pharmacist   02/09/2021 6:31 PM

## 2021-02-09 NOTE — ED Notes (Signed)
Pt did NOT receive dose of tylenol at [facility]

## 2021-02-10 ENCOUNTER — Encounter: Payer: Self-pay | Admitting: Internal Medicine

## 2021-02-10 ENCOUNTER — Observation Stay
Admit: 2021-02-10 | Discharge: 2021-02-10 | Disposition: A | Discharge status: Home / Self Care | Payer: Medicare Other | Attending: Family Medicine | Admitting: Family Medicine

## 2021-02-10 ENCOUNTER — Observation Stay: Payer: Medicare Other

## 2021-02-10 ENCOUNTER — Other Ambulatory Visit: Payer: Self-pay

## 2021-02-10 DIAGNOSIS — Z87891 Personal history of nicotine dependence: Secondary | ICD-10-CM | POA: Diagnosis not present

## 2021-02-10 DIAGNOSIS — M25512 Pain in left shoulder: Secondary | ICD-10-CM | POA: Diagnosis not present

## 2021-02-10 DIAGNOSIS — I10 Essential (primary) hypertension: Secondary | ICD-10-CM | POA: Diagnosis present

## 2021-02-10 DIAGNOSIS — R079 Chest pain, unspecified: Secondary | ICD-10-CM | POA: Diagnosis not present

## 2021-02-10 DIAGNOSIS — R000 Tachycardia, unspecified: Secondary | ICD-10-CM | POA: Diagnosis not present

## 2021-02-10 DIAGNOSIS — A419 Sepsis, unspecified organism: Secondary | ICD-10-CM

## 2021-02-10 DIAGNOSIS — Z79899 Other long term (current) drug therapy: Secondary | ICD-10-CM | POA: Diagnosis not present

## 2021-02-10 DIAGNOSIS — R509 Fever, unspecified: Secondary | ICD-10-CM | POA: Diagnosis not present

## 2021-02-10 DIAGNOSIS — M25519 Pain in unspecified shoulder: Secondary | ICD-10-CM | POA: Diagnosis present

## 2021-02-10 DIAGNOSIS — M069 Rheumatoid arthritis, unspecified: Secondary | ICD-10-CM | POA: Diagnosis present

## 2021-02-10 DIAGNOSIS — Z7982 Long term (current) use of aspirin: Secondary | ICD-10-CM | POA: Diagnosis not present

## 2021-02-10 DIAGNOSIS — R0682 Tachypnea, not elsewhere classified: Secondary | ICD-10-CM | POA: Diagnosis present

## 2021-02-10 DIAGNOSIS — I422 Other hypertrophic cardiomyopathy: Secondary | ICD-10-CM | POA: Diagnosis present

## 2021-02-10 DIAGNOSIS — R9431 Abnormal electrocardiogram [ECG] [EKG]: Secondary | ICD-10-CM | POA: Diagnosis not present

## 2021-02-10 DIAGNOSIS — R809 Proteinuria, unspecified: Secondary | ICD-10-CM | POA: Diagnosis present

## 2021-02-10 DIAGNOSIS — E785 Hyperlipidemia, unspecified: Secondary | ICD-10-CM | POA: Diagnosis present

## 2021-02-10 DIAGNOSIS — I253 Aneurysm of heart: Secondary | ICD-10-CM | POA: Diagnosis present

## 2021-02-10 DIAGNOSIS — D72829 Elevated white blood cell count, unspecified: Secondary | ICD-10-CM | POA: Diagnosis present

## 2021-02-10 DIAGNOSIS — K449 Diaphragmatic hernia without obstruction or gangrene: Secondary | ICD-10-CM | POA: Diagnosis not present

## 2021-02-10 DIAGNOSIS — I421 Obstructive hypertrophic cardiomyopathy: Secondary | ICD-10-CM | POA: Diagnosis present

## 2021-02-10 DIAGNOSIS — M0580 Other rheumatoid arthritis with rheumatoid factor of unspecified site: Secondary | ICD-10-CM | POA: Diagnosis not present

## 2021-02-10 DIAGNOSIS — E049 Nontoxic goiter, unspecified: Secondary | ICD-10-CM | POA: Diagnosis present

## 2021-02-10 DIAGNOSIS — M353 Polymyalgia rheumatica: Secondary | ICD-10-CM | POA: Diagnosis present

## 2021-02-10 DIAGNOSIS — M25511 Pain in right shoulder: Secondary | ICD-10-CM | POA: Diagnosis not present

## 2021-02-10 DIAGNOSIS — R3129 Other microscopic hematuria: Secondary | ICD-10-CM | POA: Diagnosis present

## 2021-02-10 DIAGNOSIS — R6510 Systemic inflammatory response syndrome (SIRS) of non-infectious origin without acute organ dysfunction: Secondary | ICD-10-CM | POA: Diagnosis present

## 2021-02-10 DIAGNOSIS — Z20822 Contact with and (suspected) exposure to covid-19: Secondary | ICD-10-CM | POA: Diagnosis present

## 2021-02-10 LAB — C-REACTIVE PROTEIN: CRP: 27.4 mg/dL — ABNORMAL HIGH (ref <1.0)

## 2021-02-10 LAB — URINALYSIS, COMPLETE (UACMP) WITH MICROSCOPIC
Bacteria, UA: NONE SEEN
Bilirubin Urine: NEGATIVE
Glucose, UA: NEGATIVE mg/dL
Ketones, ur: NEGATIVE mg/dL
Nitrite: NEGATIVE
Protein, ur: NEGATIVE mg/dL
Specific Gravity, Urine: 1.038 — ABNORMAL HIGH (ref 1.005–1.030)
pH: 7 (ref 5.0–8.0)

## 2021-02-10 LAB — BASIC METABOLIC PANEL
Anion gap: 8 (ref 5–15)
BUN: 13 mg/dL (ref 8–23)
CO2: 23 mmol/L (ref 22–32)
Calcium: 8.8 mg/dL — ABNORMAL LOW (ref 8.9–10.3)
Chloride: 103 mmol/L (ref 98–111)
Creatinine, Ser: 0.81 mg/dL (ref 0.44–1.00)
GFR, Estimated: >60 mL/min (ref >60)
Glucose, Bld: 120 mg/dL — ABNORMAL HIGH (ref 70–99)
Potassium: 3.2 mmol/L — ABNORMAL LOW (ref 3.5–5.1)
Sodium: 134 mmol/L — ABNORMAL LOW (ref 135–145)

## 2021-02-10 LAB — CBC
HCT: 30.5 % — ABNORMAL LOW (ref 36.0–46.0)
Hemoglobin: 10.8 g/dL — ABNORMAL LOW (ref 12.0–15.0)
MCH: 33.5 pg (ref 26.0–34.0)
MCHC: 35.4 g/dL (ref 30.0–36.0)
MCV: 94.7 fL (ref 80.0–100.0)
Platelets: 237 10*3/uL (ref 150–400)
RBC: 3.22 MIL/uL — ABNORMAL LOW (ref 3.87–5.11)
RDW: 13.2 % (ref 11.5–15.5)
WBC: 16.8 10*3/uL — ABNORMAL HIGH (ref 4.0–10.5)
nRBC: 0 % (ref 0.0–0.2)

## 2021-02-10 LAB — HIV ANTIBODY (ROUTINE TESTING W REFLEX): HIV Screen 4th Generation wRfx: NONREACTIVE

## 2021-02-10 LAB — PROCALCITONIN: Procalcitonin: 1.65 ng/mL

## 2021-02-10 LAB — VITAMIN B12: Vitamin B-12: 445 pg/mL (ref 180–914)

## 2021-02-10 LAB — CK: Total CK: 189 U/L (ref 38–234)

## 2021-02-10 LAB — TSH: TSH: 0.916 u[IU]/mL (ref 0.350–4.500)

## 2021-02-10 MED ORDER — DICLOFENAC SODIUM 1 % EX GEL
2.0000 g | Freq: Four times a day (QID) | CUTANEOUS | Status: DC | PRN
  Filled 2021-02-10: qty 100

## 2021-02-10 MED ORDER — LACTATED RINGERS IV SOLN: INTRAVENOUS | Status: DC

## 2021-02-10 MED ORDER — PREDNISONE 5 MG PO TABS
15.0000 mg | ORAL_TABLET | Freq: Every day | ORAL | Status: DC
  Administered 2021-02-10 – 2021-02-11 (×2): 15 mg via ORAL
  Filled 2021-02-10: qty 2
  Filled 2021-02-10: qty 1

## 2021-02-10 MED ORDER — IOHEXOL 350 MG/ML SOLN
75.0000 mL | Freq: Once | INTRAVENOUS | Status: AC | PRN
  Administered 2021-02-10: 75 mL via INTRAVENOUS
  Filled 2021-02-10: qty 75

## 2021-02-10 MED ORDER — PANTOPRAZOLE SODIUM 40 MG PO TBEC
40.0000 mg | DELAYED_RELEASE_TABLET | Freq: Every day | ORAL | Status: DC
  Administered 2021-02-10 – 2021-02-11 (×2): 40 mg via ORAL
  Filled 2021-02-10 (×2): qty 1

## 2021-02-10 NOTE — Progress Notes (Signed)
*  PRELIMINARY RESULTS* Echocardiogram 2D Echocardiogram has been performed.  Kathryn Mcdowell 02/10/2021, 12:15 PM

## 2021-02-10 NOTE — ED Notes (Signed)
Ultrasound in room

## 2021-02-10 NOTE — ED Notes (Signed)
To CT via stretcher

## 2021-02-10 NOTE — Consult Note (Deleted)
Cardiology Consultation Note    Patient ID: Kathryn Mcdowell, MRN: [id number], DOB/AGE: 69-Apr-1952 69 y.o. Admit date: 02/09/2021   Date of Consult: 02/10/2021 Primary Physician: Perrin Maltese, MD Primary Cardiologist: none  Chief Complaint: shoulder pain bilaterally Reason for Consultation: abnormal ekg Requesting MD: Dr. Florene Glen  HPI: Kathryn Mcdowell is a 70 y.o. female with history of hypertension and hyperlipidemia who presented to the emergency room with bilateral shoulder pain on February 09, 2021.  Pain is started several days earlier.  EKG yesterday showed sinus tachycardia with lateral T wave inversion.  Patient has no chest pain.  There are no previous EKGs to compare with.  Troponins were normal.  She was felt to be septic due to lactate white blood cell count and tachycardia.  Blood pressures were drawn.  She was treated with amlodipine 5 mg daily, hydrochlorothiazide 25 daily losartan 100 daily and atorvastatin.  CT of the chest showed no pulmonary embolism.  Felt to have thinning of the left ventricular myocardium.  Again patient has no chest pain.  Echocardiogram is pending 70 year old female with history of  Past Medical History:  Diagnosis Date   Hyperlipidemia    Hypertension       Surgical History: History reviewed. No pertinent surgical history.   Home Meds: Prior to Admission medications   Medication Sig Start Date End Date Taking? Authorizing Provider  amLODipine (NORVASC) 5 MG tablet Take 5 mg by mouth daily. 01/20/21  Yes [provider]  aspirin 81 MG EC tablet Take by mouth.   Yes [provider]  atorvastatin (LIPITOR) 20 MG tablet Take 1 tablet by mouth at bedtime. 04/14/18  Yes [provider]  calcium-vitamin D (OSCAL WITH D) 500-200 MG-UNIT TABS tablet Take 1 tablet by mouth daily.   Yes [provider]  Cholecalciferol 25 MCG (1000 UT) capsule Take by mouth.   Yes [provider]  fluticasone (FLONASE) 50 MCG/ACT nasal  spray Place into the nose. 05/10/17  Yes [provider]  hydrochlorothiazide (HYDRODIURIL) 25 MG tablet Take 25 mg by mouth daily. 01/17/21  Yes [provider]  losartan (COZAAR) 100 MG tablet Take 100 mg by mouth daily. 01/04/21  Yes [provider]    Inpatient Medications:   amLODipine  5 mg Oral Daily   aspirin EC  81 mg Oral Daily   atorvastatin  20 mg Oral QHS   enoxaparin (LOVENOX) injection  40 mg Subcutaneous Q24H   hydrochlorothiazide  25 mg Oral Daily   lidocaine  1 patch Transdermal Q24H   losartan  100 mg Oral Daily     Allergies: No Known Allergies  Social History   Socioeconomic History   Marital status: Married    Spouse name: Not on file   Number of children: Not on file   Years of education: Not on file   Highest education level: Not on file  Occupational History   Not on file  Tobacco Use   Smoking status: Never   Smokeless tobacco: Never  Substance and Sexual Activity   Alcohol use: Yes   Drug use: Not on file   Sexual activity: Not on file  Other Topics Concern   Not on file  Social History Narrative   Not on file   Social Determinants of Health   Financial Resource Strain: Not on file  Food Insecurity: Not on file  Transportation Needs: Not on file  Physical Activity: Not on file  Stress: Not on file  Social Connections: Not on file  Intimate Partner Violence: Not on file     Family History  Problem Relation Age of Onset   Breast cancer Neg Hx      Review of Systems: A 12-system review of systems was performed and is negative except as noted in the HPI.  Labs: Recent Labs    02/10/21 0739  CKTOTAL 189   Lab Results  Component Value Date   WBC 16.8 (H) 02/10/2021   HGB 10.8 (L) 02/10/2021   HCT 30.5 (L) 02/10/2021   MCV 94.7 02/10/2021   PLT 237 02/10/2021    Recent Labs  Lab 02/09/21 1422 02/10/21 0739  NA 126* 134*  K 3.4* 3.2*  CL 93* 103  CO2 21* 23  BUN 9 13  CREATININE 0.74 0.81   CALCIUM 9.3 8.8*  PROT 8.4*  --   BILITOT 1.3*  --   ALKPHOS 55  --   ALT 38  --   AST 43*  --   GLUCOSE 175* 120*   No results found for: CHOL, HDL, LDLCALC, TRIG No results found for: DDIMER  Radiology/Studies:  DG Chest 2 View  Result Date: 02/09/2021 CLINICAL DATA:  Bilateral shoulder pain EXAM: CHEST - 2 VIEW COMPARISON:  None. FINDINGS: Cardiac shadow is within normal limits. Aortic calcifications are seen. The lungs are clear. No bony abnormality is noted. IMPRESSION: No active cardiopulmonary disease. Electronically Signed   By: Inez Catalina M.D.   On: 02/09/2021 15:01   DG Shoulder Right  Result Date: 02/09/2021 CLINICAL DATA:  Acute bilateral shoulder pain. EXAM: RIGHT SHOULDER - 2+ VIEW COMPARISON:  None. FINDINGS: There is no evidence of fracture or dislocation. There is no evidence of arthropathy or other focal bone abnormality. Soft tissues are unremarkable. IMPRESSION: Negative. Electronically Signed   By: Marijo Conception M.D.   On: 02/09/2021 16:44   CT Angio Chest Pulmonary Embolism (PE) W or WO Contrast  Result Date: 02/10/2021 CLINICAL DATA:  Fever, shoulder pain, back pain, tachycardia, leukocytosis, sepsis and elevated D-dimer. EXAM: CT ANGIOGRAPHY CHEST WITH CONTRAST TECHNIQUE: Multidetector CT imaging of the chest was performed using the standard protocol during bolus administration of intravenous contrast. Multiplanar CT image reconstructions and MIPs were obtained to evaluate the vascular anatomy. CONTRAST:  58mL OMNIPAQUE IOHEXOL 350 MG/ML SOLN COMPARISON:  None. FINDINGS: Cardiovascular: The pulmonary arteries are well opacified. There is no evidence of pulmonary embolism. Central pulmonary arteries are normal in caliber. The heart size is within normal limits. No pericardial fluid identified. 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 No  evidence of visible thrombus at the level of the small apical aneurysm by CT. The thoracic aorta is normal in caliber. Mild calcified plaque is seen in the thoracic aorta. Mediastinum/Nodes: No lymphadenopathy identified. Mild enlargement of the thyroid gland is consistent with goiter. No mass effect on the airway. Small hiatal hernia. Lungs/Pleura: There is no evidence of pulmonary edema, consolidation, pneumothorax, nodule or pleural fluid. Upper Abdomen: No acute abnormality. Musculoskeletal: No chest wall abnormality. No acute or significant osseous findings. Review of the MIP images confirms the above findings. IMPRESSION: 1. No evidence of pulmonary embolism. 2. 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 Correlation with echocardiography may be helpful. 3. No acute pulmonary abnormalities. 4. With mild  thyroid enlargement consistent with goiter. 5. Small hiatal hernia. Electronically Signed   By: Aletta Edouard M.D.   On: 02/10/2021 15:52   [place] COMPLETE JOINT SPACE STRUCTURE UP LEFT  Result Date: 02/10/2021 CLINICAL DATA:  Shoulder pain EXAM: ULTRASOUND left UPPER EXTREMITY COMPLETE TECHNIQUE: Ultrasound examination was performed including evaluation of the muscles, tendons, joint, and adjacent soft tissues. COMPARISON:  None. FINDINGS: No fluid collection, mass, or other sonographic abnormality is identified. IMPRESSION: No sonographic abnormality identified along the left shoulder. Electronically Signed   By: Maurine Simmering   On: 02/10/2021 13:29   [place] COMPLETE JOINT SPACE STRUCTURES UP RIGHT  Result Date: 02/10/2021 CLINICAL DATA:  Bilateral shoulder pain for 3-4 days. No known injury. EXAM: ULTRASOUND RIGHT UPPER EXTREMITY COMPLETE TECHNIQUE: Ultrasound examination was performed including evaluation of the muscles, tendons, joint, and adjacent soft tissues. COMPARISON:  None. FINDINGS:  No fluid collection, mass or other abnormality is identified. IMPRESSION: Negative exam. Electronically Signed   By: Inge Rise M.D.   On: 02/10/2021 13:25   DG Shoulder Left  Result Date: 02/09/2021 CLINICAL DATA:  Acute bilateral shoulder pain. EXAM: LEFT SHOULDER - 2+ VIEW COMPARISON:  None. FINDINGS: There is no evidence of fracture or dislocation. There is no evidence of arthropathy or other focal bone abnormality. Soft tissues are unremarkable. IMPRESSION: Negative. Electronically Signed   By: Marijo Conception M.D.   On: 02/09/2021 16:43   MM 3D SCREEN BREAST BILATERAL  Result Date: 02/07/2021 CLINICAL DATA:  Screening. EXAM: DIGITAL SCREENING BILATERAL MAMMOGRAM WITH TOMOSYNTHESIS AND CAD TECHNIQUE: Bilateral screening digital craniocaudal and mediolateral oblique mammograms were obtained. Bilateral screening digital breast tomosynthesis was performed. The images were evaluated with computer-aided detection. COMPARISON:  Previous exam(s). ACR Breast Density Category b: There are scattered areas of fibroglandular density. FINDINGS: There are no findings suspicious for malignancy. IMPRESSION: No mammographic evidence of malignancy. A result letter of this screening mammogram will be mailed directly to the patient. RECOMMENDATION: Screening mammogram in one year. (Code:SM-B-01Y) BI-RADS CATEGORY  1: Negative. Electronically Signed   By: Lajean Manes M.D.   On: 02/07/2021 10:42   Wt Readings from Last 3 Encounters:  02/09/21 78 kg    EKG: Sinus tachycardia with T wave inversions in the i lateral leads.  Physical Exam:  Blood pressure 132/67, pulse 93, temperature 99.7 F (37.6 C), resp. rate 16, height 5\' 3"  (1.6 m), weight 78 kg, SpO2 96 %. Body mass index is 30.47 kg/m. General: Well developed, well nourished, in no acute distress. Head: Normocephalic, atraumatic, sclera non-icteric, no xanthomas, nares are without discharge.  Neck: Negative for carotid bruits. JVD not  elevated. Lungs: Clear bilaterally to auscultation without wheezes, rales, or rhonchi. Breathing is unlabored. Heart: RRR with S1 S2. No murmurs, rubs, or gallops appreciated. Abdomen: Soft, non-tender, non-distended with normoactive bowel sounds. No hepatomegaly. No rebound/guarding. No obvious abdominal masses. Msk:  Strength and tone appear normal for age. Extremities: No clubbing or cyanosis. No edema.  Distal pedal pulses are 2+ and equal bilaterally. Neuro: Alert and oriented X 3. No facial asymmetry. No focal deficit. Moves all extremities spontaneously. Psych:  Responds to questions appropriately with a normal affect.     Assessment and Plan  70 year old female with history of hypertension hyperlipidemia presented with shoulder pain.  Ruled out for myocardial infarction.  EKG abnormal but not changing.  She has no chest pain.  Troponins were normal.  Chest CT suggested possible LV aneurysm.  Echo is pending.  1.  Abnormal  chest CT-echo pending.  Will review when available.  For now we will control blood pressure.  Does not appear ischemic.  EKG is abnormal with chest pain or elevated troponins, this likely is chronic.  We will review echo when available and make further recommendations.  Signed, Teodoro Spray MD 02/10/2021, 4:31 PM Pager: [phone]

## 2021-02-10 NOTE — Progress Notes (Signed)
PROGRESS NOTE    Kathryn Mcdowell  Kathryn Mcdowell:[id number] DOB: 1950-10-06 DOA: 02/09/2021 PCP: Kathryn Maltese, MD   Chief Complaint  Patient presents with   Shoulder Pain   Fever   Weakness    Brief Narrative:  Kathryn Mcdowell is Kathryn Mcdowell 70 y.o. female with medical history significant for hypertension and hyperlipidemia presents to the emergency via [facility] for chief concern of bilateral shoulder pain that started on Tuesday.   Assessment & Plan:   Active Problems:   Sepsis ([place])   Essential hypertension   Hyperlipidemia  Systemic Inflammatory Response Syndrome  Fever, leukocytosis, tachy, tachypnea - no clear infectious source UA with protein, hb, but not concerning for infection Blood cultures pending CT PE protocol without PE or acute pulm abnormalities Procal is up as well as inflammatory markers Leukocytosis, follow  ? If related to rheumatologic process or other cause - see below  Bilateral Should Pain and Stiffness New onset bilateral shoulder pain and stiffness since Tuesday.  No hip pain, no headaches, no vision changes, no jaw claudication.   [place] shoulders wnl ?polymyalgia rheumatica - seems to fit.  Ddx includes RA, etc.  Pending RF, anti ccp, ANA.  CK wnl.  CRP and ESR elevated. 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  Echo ordered and pending - negative troponin x2.  CT PE protocol noted small L ventricular aneurysm Cardiology c/s, appreciate assistance  Abnormal UA  Proteinuria 100 mg/dl protein, Hb positive, but only 0-5 RBC Follow repeat, follow outpatient   Hypertenson HCTZ, amlodipine, losartan  HLD Lipitor  DVT prophylaxis: lovenox Code Status: full  Family Communication: none at bedside Disposition:   Status is: Observation  The  patient will require care spanning > 2 midnights and should be moved to inpatient because: Inpatient level of care appropriate due to severity of illness  Dispo: The patient is from: Home              Anticipated d/c is to: Home              Patient currently is not medically stable to d/c.   Difficult to place patient No       Consultants:  cardiology  Procedures:  Echo pending   Antimicrobials:  Anti-infectives (From admission, onward)    Start     Dose/Rate Route Frequency Ordered Stop   02/10/21 2000  vancomycin (VANCOCIN) IVPB 1000 mg/200 mL premix  Status:  Discontinued        1,000 mg 200 mL/hr over 60 Minutes Intravenous Every 24 hours 02/09/21 1830 02/10/21 1119   02/10/21 0200  metroNIDAZOLE (FLAGYL) IVPB 500 mg  Status:  Discontinued        500 mg 100 mL/hr over 60 Minutes Intravenous Every 8 hours 02/09/21 2233 02/10/21 1119   02/10/21 0100  ceFEPIme (MAXIPIME) 2 g in sodium chloride 0.9 % 100 mL IVPB  Status:  Discontinued        2 g 200 mL/hr over 30 Minutes Intravenous Every 8 hours 02/09/21 1821 02/10/21 1119   02/09/21 1615  vancomycin (VANCOREADY) IVPB 1750 mg/350 mL        1,750 mg 175 mL/hr over 120 Minutes Intravenous  Once 02/09/21 1602 02/09/21 2352   02/09/21 1600  ceFEPIme (MAXIPIME) 2 g in sodium  chloride 0.9 % 100 mL IVPB        2 g 200 mL/hr over 30 Minutes Intravenous  Once 02/09/21 1548 02/09/21 1814   02/09/21 1600  metroNIDAZOLE (FLAGYL) IVPB 500 mg        500 mg 100 mL/hr over 60 Minutes Intravenous  Once 02/09/21 1548 02/09/21 2020   02/09/21 1600  vancomycin (VANCOCIN) IVPB 1000 mg/200 mL premix  Status:  Discontinued        1,000 mg 200 mL/hr over 60 Minutes Intravenous  Once 02/09/21 1548 02/09/21 1602          Subjective: Pain is improving  Objective: Vitals:   02/10/21 0600 02/10/21 0815 02/10/21 1033 02/10/21 1525  BP: (!) 142/74  131/71 132/67  Pulse: 100 87 89 93  Resp: _0 Temp:   98.3 F (36.8 C) 99.7 F  (37.6 C)  TempSrc:   Oral   SpO2: 93% 97% 97% 96%  Weight:      Height:        Intake/Output Summary (Last 24 hours) at 02/10/2021 1620 Last data filed at 02/10/2021 0653 Gross per 24 hour  Intake 1000.16 ml  Output --  Net 1000.16 ml   Filed Weights   02/09/21 1409  Weight: 78 kg    Examination:  General exam: Appears calm and comfortable  Respiratory system: Clear to auscultation. Respiratory effort normal. Cardiovascular system: S1 & S2 heard, RRR.  Gastrointestinal system: Abdomen is nondistended, soft and nontender. Central nervous system: Alert and oriented. No focal neurological deficits. Extremities: no LEE - able to lift arms over head - mild ttp to palpation of shoulders bilaterally Skin: No rashes, lesions or ulcers Psychiatry: Judgement and insight appear normal. Mood & affect appropriate.     Data Reviewed: I have personally reviewed following labs and imaging studies  CBC: Recent Labs  Lab 02/09/21 1422 02/10/21 0739  WBC 21.7* 16.8*  NEUTROABS 18.7*  --   HGB 11.7* 10.8*  HCT 32.3* 30.5*  MCV 92.8 94.7  PLT 247 160    M[facility]owell Metabolic Panel: Recent Labs  Lab 02/09/21 1422 02/10/21 0739  NA 126* 134*  K 3.4* 3.2*  CL 93* 103  CO2 21* 23  GLUCOSE 175* 120*  BUN 9 13  CREATININE 0.74 0.81  CALCIUM 9.3 8.8*    GFR: Estimated Creatinine Clearance: 64.8 mL/min (by C-G formula based on SCr of 0.81 mg/dL).  Liver Function Tests: Recent Labs  Lab 02/09/21 1422  AST 43*  ALT 38  ALKPHOS 55  BILITOT 1.3*  PROT 8.4*  ALBUMIN 4.2    CBG: No results for input(s): GLUCAP in the last 168 hours.   Recent Results (from the past 240 hour(s))  Resp Panel by RT-PCR (Flu Emerie Vanderkolk&B, Covid) Nasopharyngeal Swab     Status: None   Collection Time: 02/09/21  3:44 PM   Specimen: Nasopharyngeal Swab; Nasopharyngeal(NP) swabs in vial transport medium  Result Value Ref Range Status   SARS Coronavirus 2 by RT PCR NEGATIVE NEGATIVE Final    Comment:  (NOTE) SARS-CoV-2 target nucleic acids are NOT DETECTED.  The SARS-CoV-2 RNA is generally detectable in upper respiratory specimens during the acute phase of infection. The lowest concentration of SARS-CoV-2 viral copies this assay can detect is 138 copies/mL. Jla Reynolds negative result does not preclude SARS-Cov-2 infection and should not be used as the sole basis for treatment or other patient management decisions. Tkeyah Burkman negative result may occur with  improper specimen collection/handling, submission of specimen  other than nasopharyngeal swab, presence of viral mutation(s) within the areas targeted by this assay, and inadequate number of viral copies(<138 copies/mL). Ife Vitelli negative result must be combined with clinical observations, patient history, and epidemiological information. The expected result is Negative.  Fact Sheet for Patients:  EntrepreneurPulse.com.au  Fact Sheet for Healthcare Providers:  IncredibleEmployment.be  This test is no t yet approved or cleared by the [place] FDA and  has been authorized for detection and/or diagnosis of SARS-CoV-2 by FDA under an Emergency Use Authorization (EUA). This EUA will remain  in effect (meaning this test can be used) for the duration of the COVID-19 declaration under Section 564(b)(1) of the Act, 21 U.S.C.section 360bbb-3(b)(1), unless the authorization is terminated  or revoked sooner.       Influenza Shama Monfils by PCR NEGATIVE NEGATIVE Final   Influenza B by PCR NEGATIVE NEGATIVE Final    Comment: (NOTE) The Xpert Xpress SARS-CoV-2/FLU/RSV plus assay is intended as an aid in the diagnosis of influenza from Nasopharyngeal swab specimens and should not be used as Ryota Treece sole basis for treatment. Nasal washings and aspirates are unacceptable for Xpert Xpress SARS-CoV-2/FLU/RSV testing.  Fact Sheet for Patients: EntrepreneurPulse.com.au  Fact Sheet for Healthcare  Providers: IncredibleEmployment.be  This test is not yet approved or cleared by the [place] FDA and has been authorized for detection and/or diagnosis of SARS-CoV-2 by FDA under an Emergency Use Authorization (EUA). This EUA will remain in effect (meaning this test can be used) for the duration of the COVID-19 declaration under Section 564(b)(1) of the Act, 21 U.S.C. section 360bbb-3(b)(1), unless the authorization is terminated or revoked.  Performed at [facility], [address] [place], [place] [address]   Blood culture (routine x 2)     Status: None (Preliminary result)   Collection Time: 02/09/21  5:50 PM   Specimen: BLOOD  Result Value Ref Range Status   Specimen Description BLOOD BLOOD RIGHT HAND  Final   Special Requests   Final    BOTTLES DRAWN AEROBIC AND ANAEROBIC Blood Culture adequate volume   Culture   Final    NO GROWTH < 24 HOURS Performed at [facility], [address], [place], [place] [address]    Report Status PENDING  Incomplete  Blood culture (routine x 2)     Status: None (Preliminary result)   Collection Time: 02/09/21  5:50 PM   Specimen: BLOOD  Result Value Ref Range Status   Specimen Description BLOOD BLOOD RIGHT ARM  Final   Special Requests   Final    BOTTLES DRAWN AEROBIC AND ANAEROBIC Blood Culture adequate volume   Culture   Final    NO GROWTH < 24 HOURS Performed at [facility], [address] [place],  [address]    Report Status PENDING  Incomplete         Radiology Studies: DG Chest 2 View  Result Date: 02/09/2021 CLINICAL DATA:  Bilateral shoulder pain EXAM: CHEST - 2 VIEW COMPARISON:  None. FINDINGS: Cardiac shadow is within normal limits. Aortic calcifications are seen. The lungs are clear. No bony abnormality is noted. IMPRESSION: No active cardiopulmonary disease. Electronically Signed   By: Inez Catalina M.D.   On: 02/09/2021 15:01   DG Shoulder Right  Result  Date: 02/09/2021 CLINICAL DATA:  Acute bilateral shoulder pain. EXAM: RIGHT SHOULDER - 2+ VIEW COMPARISON:  None. FINDINGS: There is no evidence of fracture or dislocation. There is no evidence of arthropathy or other focal bone abnormality.  Soft tissues are unremarkable. IMPRESSION: Negative. Electronically Signed   By: Marijo Conception M.D.   On: 02/09/2021 16:44   CT Angio Chest Pulmonary Embolism (PE) W or WO Contrast  Result Date: 02/10/2021 CLINICAL DATA:  Fever, shoulder pain, back pain, tachycardia, leukocytosis, sepsis and elevated D-dimer. EXAM: CT ANGIOGRAPHY CHEST WITH CONTRAST TECHNIQUE: Multidetector CT imaging of the chest was performed using the standard protocol during bolus administration of intravenous contrast. Multiplanar CT image reconstructions and MIPs were obtained to evaluate the vascular anatomy. CONTRAST:  79m OMNIPAQUE IOHEXOL 350 MG/ML SOLN COMPARISON:  None. FINDINGS: Cardiovascular: The pulmonary arteries are well opacified. There is no evidence of pulmonary embolism. Central pulmonary arteries are normal in caliber. The heart size is within normal limits. No pericardial fluid identified. 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 No evidence of visible thrombus at the level of the small apical aneurysm by CT. The thoracic aorta is normal in caliber. Mild calcified plaque is seen in the thoracic aorta. Mediastinum/Nodes: No lymphadenopathy identified. Mild enlargement of the thyroid gland is consistent with goiter. No mass effect on the airway. Small hiatal hernia. Lungs/Pleura: There is no evidence of pulmonary edema, consolidation, pneumothorax, nodule or pleural fluid. Upper Abdomen: No acute abnormality. Musculoskeletal: No chest wall abnormality. No acute or significant osseous findings. Review of the MIP images confirms the above findings. IMPRESSION:  1. No evidence of pulmonary embolism. 2. 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 Correlation with echocardiography may be helpful. 3. No acute pulmonary abnormalities. 4. With mild thyroid enlargement consistent with goiter. 5. Small hiatal hernia. Electronically Signed   By: GAletta EdouardM.D.   On: 02/10/2021 15:52   [place] JOINT SPACE STRUCTURE UP LEFT  Result Date: 02/10/2021 CLINICAL DATA:  Shoulder pain EXAM: ULTRASOUND left UPPER EXTREMITY COMPLETE TECHNIQUE: Ultrasound examination was performed including evaluation of the muscles, tendons, joint, and adjacent soft tissues. COMPARISON:  None. FINDINGS: No fluid collection, mass, or other sonographic abnormality is identified. IMPRESSION: No sonographic abnormality identified along the left shoulder. Electronically Signed   By: JMaurine Simmering  On: 02/10/2021 13:29   [place] JOINT SPACE STRUCTURES UP RIGHT  Result Date: 02/10/2021 CLINICAL DATA:  Bilateral shoulder pain for 3-4 days. No known injury. EXAM: ULTRASOUND RIGHT UPPER EXTREMITY COMPLETE TECHNIQUE: Ultrasound examination was performed including evaluation of the muscles, tendons, joint, and adjacent soft tissues. COMPARISON:  None. FINDINGS: No fluid collection, mass or other abnormality is identified. IMPRESSION: Negative exam. Electronically Signed   By: TInge RiseM.D.   On: 02/10/2021 13:25   DG Shoulder Left  Result Date: 02/09/2021 CLINICAL DATA:  Acute bilateral shoulder pain. EXAM: LEFT SHOULDER - 2+ VIEW COMPARISON:  None. FINDINGS: There is no evidence of fracture or dislocation. There is no evidence of arthropathy or other focal bone abnormality. Soft tissues are unremarkable. IMPRESSION: Negative. Electronically Signed   By: JMarijo ConceptionM.D.   On: 02/09/2021 16:43        Scheduled Meds:  amLODipine  5 mg Oral Daily   aspirin EC  81  mg Oral Daily   atorvastatin  20 mg Oral QHS   enoxaparin (LOVENOX) injection  40 mg Subcutaneous Q24H   hydrochlorothiazide  25 mg Oral Daily   lidocaine  1 patch  Transdermal Q24H   losartan  100 mg Oral Daily   Continuous Infusions:   LOS: 0 days    Time spent: over 30 min    Fayrene Helper, MD [facility]   To contact the attending provider between 7A-7P or the covering provider during after hours 7P-7A, please log into the web site [email or web address] and access using universal [place] password for that web site. If you do not have the password, please call the hospital operator.  02/10/2021, 4:20 PM

## 2021-02-10 NOTE — ED Notes (Signed)
Patient eating lunch tray, then will attempt to get appropriate IV for CT angio

## 2021-02-11 DIAGNOSIS — M0580 Other rheumatoid arthritis with rheumatoid factor of unspecified site: Secondary | ICD-10-CM

## 2021-02-11 LAB — ECHOCARDIOGRAM COMPLETE
AR max vel: 2.41 cm2
AV Area VTI: 2.52 cm2
AV Area mean vel: 2.62 cm2
AV Mean grad: 5 mmHg
AV Peak grad: 8.5 mmHg
Ao pk vel: 1.46 m/s
Area-P 1/2: 5.38 cm2
Height: 63 in
S' Lateral: 2.6 cm
Weight: 2752 oz

## 2021-02-11 LAB — RHEUMATOID FACTOR: Rheumatoid fact SerPl-aCnc: 91 IU/mL — ABNORMAL HIGH (ref <14.0)

## 2021-02-11 LAB — CBC WITH DIFFERENTIAL/PLATELET
Abs Immature Granulocytes: 0.07 10*3/uL (ref 0.00–0.07)
Basophils Absolute: 0 10*3/uL (ref 0.0–0.1)
Basophils Relative: 0 %
Eosinophils Absolute: 0 10*3/uL (ref 0.0–0.5)
Eosinophils Relative: 0 %
HCT: 28.7 % — ABNORMAL LOW (ref 36.0–46.0)
Hemoglobin: 10.3 g/dL — ABNORMAL LOW (ref 12.0–15.0)
Immature Granulocytes: 1 %
Lymphocytes Relative: 11 %
Lymphs Abs: 1.5 10*3/uL (ref 0.7–4.0)
MCH: 33.1 pg (ref 26.0–34.0)
MCHC: 35.9 g/dL (ref 30.0–36.0)
MCV: 92.3 fL (ref 80.0–100.0)
Monocytes Absolute: 0.9 10*3/uL (ref 0.1–1.0)
Monocytes Relative: 6 %
Neutro Abs: 11.9 10*3/uL — ABNORMAL HIGH (ref 1.7–7.7)
Neutrophils Relative %: 82 %
Platelets: 251 10*3/uL (ref 150–400)
RBC: 3.11 MIL/uL — ABNORMAL LOW (ref 3.87–5.11)
RDW: 13.4 % (ref 11.5–15.5)
WBC: 14.4 10*3/uL — ABNORMAL HIGH (ref 4.0–10.5)
nRBC: 0 % (ref 0.0–0.2)

## 2021-02-11 LAB — COMPREHENSIVE METABOLIC PANEL
ALT: 47 U/L — ABNORMAL HIGH (ref 0–44)
AST: 28 U/L (ref 15–41)
Albumin: 3.2 g/dL — ABNORMAL LOW (ref 3.5–5.0)
Alkaline Phosphatase: 66 U/L (ref 38–126)
Anion gap: 6 (ref 5–15)
BUN: 13 mg/dL (ref 8–23)
CO2: 25 mmol/L (ref 22–32)
Calcium: 9.3 mg/dL (ref 8.9–10.3)
Chloride: 106 mmol/L (ref 98–111)
Creatinine, Ser: 0.77 mg/dL (ref 0.44–1.00)
GFR, Estimated: >60 mL/min (ref >60)
Glucose, Bld: 114 mg/dL — ABNORMAL HIGH (ref 70–99)
Potassium: 3.5 mmol/L (ref 3.5–5.1)
Sodium: 137 mmol/L (ref 135–145)
Total Bilirubin: 1 mg/dL (ref 0.3–1.2)
Total Protein: 7.4 g/dL (ref 6.5–8.1)

## 2021-02-11 LAB — PROCALCITONIN: Procalcitonin: 1.13 ng/mL

## 2021-02-11 LAB — PHOSPHORUS: Phosphorus: 3.3 mg/dL (ref 2.5–4.6)

## 2021-02-11 LAB — MAGNESIUM: Magnesium: 2 mg/dL (ref 1.7–2.4)

## 2021-02-11 LAB — SEDIMENTATION RATE: Sed Rate: 127 mm/hr — ABNORMAL HIGH (ref 0–30)

## 2021-02-11 LAB — C-REACTIVE PROTEIN
CRP: 33.5 mg/dL — ABNORMAL HIGH (ref <1.0)
CRP: 32.4 mg/dL — ABNORMAL HIGH (ref <1.0)

## 2021-02-11 MED ORDER — PANTOPRAZOLE SODIUM 40 MG PO TBEC: 40.0000 mg | DELAYED_RELEASE_TABLET | Freq: Every day | ORAL | 0 refills | Status: AC

## 2021-02-11 MED ORDER — PREDNISONE 5 MG PO TABS: 15.0000 mg | ORAL_TABLET | Freq: Every day | ORAL | 0 refills | Status: AC

## 2021-02-11 NOTE — Consult Note (Signed)
Kathryn Mcdowell is a 70 y.o. female  [id number]  Primary Cardiologist: Neoma Laming Reason for Consultation: Shoulder pain  HPI: This is a 70 year old African-American female who presented to the hospital with shoulder pain and was supposed to get steroid for possible arthritis and had a CT done chest which showed possible aneurysm in the apex.  I was asked to evaluate the patient.  Patient denies any chest pain or shortness of breath.   Review of Systems: No orthopnea PND or leg swelling.   Past Medical History:  Diagnosis Date   Hyperlipidemia    Hypertension     Medications Prior to Admission  Medication Sig Dispense Refill   amLODipine (NORVASC) 5 MG tablet Take 5 mg by mouth daily.     aspirin 81 MG EC tablet Take by mouth.     atorvastatin (LIPITOR) 20 MG tablet Take 1 tablet by mouth at bedtime.     calcium-vitamin D (OSCAL WITH D) 500-200 MG-UNIT TABS tablet Take 1 tablet by mouth daily.     Cholecalciferol 25 MCG (1000 UT) capsule Take by mouth.     fluticasone (FLONASE) 50 MCG/ACT nasal spray Place into the nose.     hydrochlorothiazide (HYDRODIURIL) 25 MG tablet Take 25 mg by mouth daily.     losartan (COZAAR) 100 MG tablet Take 100 mg by mouth daily.        amLODipine  5 mg Oral Daily   aspirin EC  81 mg Oral Daily   atorvastatin  20 mg Oral QHS   enoxaparin (LOVENOX) injection  40 mg Subcutaneous Q24H   hydrochlorothiazide  25 mg Oral Daily   lidocaine  1 patch Transdermal Q24H   losartan  100 mg Oral Daily   pantoprazole  40 mg Oral Daily   predniSONE  15 mg Oral Q breakfast    Infusions:  lactated ringers 50 mL/hr at 02/10/21 2257    No Known Allergies  Social History   Socioeconomic History   Marital status: Married    Spouse name: Not on file   Number of children: Not on file   Years of education: Not on file   Highest education level: Not on file  Occupational History   Not on file  Tobacco Use   Smoking status: Never   Smokeless  tobacco: Never  Substance and Sexual Activity   Alcohol use: Yes   Drug use: Not on file   Sexual activity: Not on file  Other Topics Concern   Not on file  Social History Narrative   Not on file   Social Determinants of Health   Financial Resource Strain: Not on file  Food Insecurity: Not on file  Transportation Needs: Not on file  Physical Activity: Not on file  Stress: Not on file  Social Connections: Not on file  Intimate Partner Violence: Not on file    Family History  Problem Relation Age of Onset   Breast cancer Neg Hx     PHYSICAL EXAM: Vitals:   02/11/21 0420 02/11/21 0859  BP: 123/68 134/64  Pulse: 91 84  Resp: 16 18  Temp: 98.6 F (37 C) 98.8 F (37.1 C)  SpO2: 97% 96%    No intake or output data in the 24 hours ending 02/11/21 0946  General:  Well appearing. No respiratory difficulty HEENT: normal Neck: supple. no JVD. Carotids 2+ bilat; no bruits. No lymphadenopathy or thryomegaly appreciated. Cor: PMI nondisplaced. Regular rate & rhythm. No rubs, gallops or murmurs. Lungs: clear  Abdomen: soft, nontender, nondistended. No hepatosplenomegaly. No bruits or masses. Good bowel sounds. Extremities: no cyanosis, clubbing, rash, edema Neuro: alert & oriented x 3, cranial nerves grossly intact. moves all 4 extremities w/o difficulty. Affect pleasant.  ECG: Sinus tachycardia with diffuse T wave inversion in the inferolateral leads with LVH  Results for orders placed or performed during the hospital encounter of 02/09/21 (from the past 24 hour(s))  Urinalysis, Complete w Microscopic Urine, Clean Catch     Status: Abnormal   Collection Time: 02/10/21  5:49 PM  Result Value Ref Range   Color, Urine YELLOW (A) YELLOW   APPearance CLEAR (A) CLEAR   Specific Gravity, Urine 1.038 (H) 1.005 - 1.030   pH 7.0 5.0 - 8.0   Glucose, UA NEGATIVE NEGATIVE mg/dL   Hgb urine dipstick SMALL (A) NEGATIVE   Bilirubin Urine NEGATIVE NEGATIVE   Ketones, ur NEGATIVE  NEGATIVE mg/dL   Protein, ur NEGATIVE NEGATIVE mg/dL   Nitrite NEGATIVE NEGATIVE   Leukocytes,Ua SMALL (A) NEGATIVE   RBC / HPF 6-10 0 - 5 RBC/hpf   WBC, UA 6-10 0 - 5 WBC/hpf   Bacteria, UA NONE SEEN NONE SEEN   Squamous Epithelial / LPF 0-5 0 - 5  Procalcitonin     Status: None   Collection Time: 02/11/21  5:07 AM  Result Value Ref Range   Procalcitonin 1.13 ng/mL  CBC with Differential/Platelet     Status: Abnormal   Collection Time: 02/11/21  5:07 AM  Result Value Ref Range   WBC 14.4 (H) 4.0 - 10.5 K/uL   RBC 3.11 (L) 3.87 - 5.11 MIL/uL   Hemoglobin 10.3 (L) 12.0 - 15.0 g/dL   HCT 28.7 (L) 36.0 - 46.0 %   MCV 92.3 80.0 - 100.0 fL   MCH 33.1 26.0 - 34.0 pg   MCHC 35.9 30.0 - 36.0 g/dL   RDW 13.4 11.5 - 15.5 %   Platelets 251 150 - 400 K/uL   nRBC 0.0 0.0 - 0.2 %   Neutrophils Relative % 82 %   Neutro Abs 11.9 (H) 1.7 - 7.7 K/uL   Lymphocytes Relative 11 %   Lymphs Abs 1.5 0.7 - 4.0 K/uL   Monocytes Relative 6 %   Monocytes Absolute 0.9 0.1 - 1.0 K/uL   Eosinophils Relative 0 %   Eosinophils Absolute 0.0 0.0 - 0.5 K/uL   Basophils Relative 0 %   Basophils Absolute 0.0 0.0 - 0.1 K/uL   Immature Granulocytes 1 %   Abs Immature Granulocytes 0.07 0.00 - 0.07 K/uL  Comprehensive metabolic panel     Status: Abnormal   Collection Time: 02/11/21  5:07 AM  Result Value Ref Range   Sodium 137 135 - 145 mmol/L   Potassium 3.5 3.5 - 5.1 mmol/L   Chloride 106 98 - 111 mmol/L   CO2 25 22 - 32 mmol/L   Glucose, Bld 114 (H) 70 - 99 mg/dL   BUN 13 8 - 23 mg/dL   Creatinine, Ser 0.77 0.44 - 1.00 mg/dL   Calcium 9.3 8.9 - 10.3 mg/dL   Total Protein 7.4 6.5 - 8.1 g/dL   Albumin 3.2 (L) 3.5 - 5.0 g/dL   AST 28 15 - 41 U/L   ALT 47 (H) 0 - 44 U/L   Alkaline Phosphatase 66 38 - 126 U/L   Total Bilirubin 1.0 0.3 - 1.2 mg/dL   GFR, Estimated >60 >60 mL/min   Anion gap 6 5 - 15  Magnesium  Status: None   Collection Time: 02/11/21  5:07 AM  Result Value Ref Range   Magnesium 2.0  1.7 - 2.4 mg/dL  Phosphorus     Status: None   Collection Time: 02/11/21  5:07 AM  Result Value Ref Range   Phosphorus 3.3 2.5 - 4.6 mg/dL   DG Chest 2 View  Result Date: 02/09/2021 CLINICAL DATA:  Bilateral shoulder pain EXAM: CHEST - 2 VIEW COMPARISON:  None. FINDINGS: Cardiac shadow is within normal limits. Aortic calcifications are seen. The lungs are clear. No bony abnormality is noted. IMPRESSION: No active cardiopulmonary disease. Electronically Signed   By: Inez Catalina M.D.   On: 02/09/2021 15:01   DG Shoulder Right  Result Date: 02/09/2021 CLINICAL DATA:  Acute bilateral shoulder pain. EXAM: RIGHT SHOULDER - 2+ VIEW COMPARISON:  None. FINDINGS: There is no evidence of fracture or dislocation. There is no evidence of arthropathy or other focal bone abnormality. Soft tissues are unremarkable. IMPRESSION: Negative. Electronically Signed   By: Marijo Conception M.D.   On: 02/09/2021 16:44   CT Angio Chest Pulmonary Embolism (PE) W or WO Contrast  Result Date: 02/10/2021 CLINICAL DATA:  Fever, shoulder pain, back pain, tachycardia, leukocytosis, sepsis and elevated D-dimer. EXAM: CT ANGIOGRAPHY CHEST WITH CONTRAST TECHNIQUE: Multidetector CT imaging of the chest was performed using the standard protocol during bolus administration of intravenous contrast. Multiplanar CT image reconstructions and MIPs were obtained to evaluate the vascular anatomy. CONTRAST:  53mL OMNIPAQUE IOHEXOL 350 MG/ML SOLN COMPARISON:  None. FINDINGS: Cardiovascular: The pulmonary arteries are well opacified. There is no evidence of pulmonary embolism. Central pulmonary arteries are normal in caliber. The heart size is within normal limits. No pericardial fluid identified. 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 No evidence of visible thrombus at the level of the small apical aneurysm by CT. The  thoracic aorta is normal in caliber. Mild calcified plaque is seen in the thoracic aorta. Mediastinum/Nodes: No lymphadenopathy identified. Mild enlargement of the thyroid gland is consistent with goiter. No mass effect on the airway. Small hiatal hernia. Lungs/Pleura: There is no evidence of pulmonary edema, consolidation, pneumothorax, nodule or pleural fluid. Upper Abdomen: No acute abnormality. Musculoskeletal: No chest wall abnormality. No acute or significant osseous findings. Review of the MIP images confirms the above findings. IMPRESSION: 1. No evidence of pulmonary embolism. 2. 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 Correlation with echocardiography may be helpful. 3. No acute pulmonary abnormalities. 4. With mild thyroid enlargement consistent with goiter. 5. Small hiatal hernia. Electronically Signed   By: Aletta Edouard M.D.   On: 02/10/2021 15:52   [place] COMPLETE JOINT SPACE STRUCTURE UP LEFT  Result Date: 02/10/2021 CLINICAL DATA:  Shoulder pain EXAM: ULTRASOUND left UPPER EXTREMITY COMPLETE TECHNIQUE: Ultrasound examination was performed including evaluation of the muscles, tendons, joint, and adjacent soft tissues. COMPARISON:  None. FINDINGS: No fluid collection, mass, or other sonographic abnormality is identified. IMPRESSION: No sonographic abnormality identified along the left shoulder. Electronically Signed   By: Maurine Simmering   On: 02/10/2021 13:29   [place] COMPLETE JOINT SPACE STRUCTURES UP RIGHT  Result Date: 02/10/2021 CLINICAL DATA:  Bilateral shoulder pain for 3-4 days. No known injury. EXAM: ULTRASOUND RIGHT UPPER EXTREMITY COMPLETE TECHNIQUE: Ultrasound examination was performed including evaluation of the  muscles, tendons, joint, and adjacent soft tissues. COMPARISON:  None. FINDINGS: No fluid collection, mass or other abnormality is identified. IMPRESSION: Negative  exam. Electronically Signed   By: Inge Rise M.D.   On: 02/10/2021 13:25   DG Shoulder Left  Result Date: 02/09/2021 CLINICAL DATA:  Acute bilateral shoulder pain. EXAM: LEFT SHOULDER - 2+ VIEW COMPARISON:  None. FINDINGS: There is no evidence of fracture or dislocation. There is no evidence of arthropathy or other focal bone abnormality. Soft tissues are unremarkable. IMPRESSION: Negative. Electronically Signed   By: Marijo Conception M.D.   On: 02/09/2021 16:43   ECHOCARDIOGRAM COMPLETE  Result Date: 02/11/2021    ECHOCARDIOGRAM REPORT   Patient Name:   Kathryn Mcdowell Date of Exam: 02/10/2021 Medical Rec #:  [id number]       Height:       63.0 in Accession #:    [id number]      Weight:       172.0 lb Date of Birth:  Feb 02, 1951       BSA:          1.814 m Patient Age:    32 years        BP:           131/71 mmHg Patient Gender: F               HR:           89 bpm. Exam Location:  [facility] Procedure: 2D Echo, Cardiac Doppler and Color Doppler Indications:     Abnormal ECG R 94.31  History:         Patient has no prior history of Echocardiogram examinations.                  Risk Factors:Hypertension and Dyslipidemia.  Sonographer:     Sherrie Sport RDCS (AE) Referring Phys:  [place] Diagnosing Phys: Neoma Laming MD  Sonographer Comments: Suboptimal apical window. IMPRESSIONS  1. Left ventricular ejection fraction, by estimation, is 60 to 65%. The left ventricle has normal function. The left ventricle has no regional wall motion abnormalities. There is severe asymmetric left ventricular hypertrophy of the apical segment. Left  ventricular diastolic parameters are consistent with Grade I diastolic dysfunction (impaired relaxation).  2. Right ventricular systolic function is normal. The right ventricular size is normal.  3. The mitral valve is normal in structure. Trivial mitral valve regurgitation. No evidence of mitral stenosis.  4. The aortic valve is normal in structure. Aortic valve  regurgitation is not visualized. Mild aortic valve sclerosis is present, with no evidence of aortic valve stenosis.  5. The inferior vena cava is normal in size with greater than 50% respiratory variability, suggesting right atrial pressure of 3 mmHg. 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 FINDINGS  Left Ventricle: Left ventricular ejection fraction, by estimation, is 60 to 65%. The left ventricle has normal function. The left ventricle has no regional wall motion abnormalities. The left ventricular internal cavity size was normal in size. There is  severe asymmetric left ventricular hypertrophy of the apical segment. Left ventricular diastolic parameters are consistent with Grade I diastolic dysfunction (impaired relaxation). Right Ventricle: The right ventricular size is normal. No increase in right ventricular wall thickness. Right ventricular systolic function is normal. Left Atrium: Left atrial size was normal in size. Right Atrium: Right atrial size was normal in size. Pericardium: There is no evidence of pericardial effusion. Mitral Valve: The mitral  valve is normal in structure. Trivial mitral valve regurgitation. No evidence of mitral valve stenosis. Tricuspid Valve: The tricuspid valve is normal in structure. Tricuspid valve regurgitation is trivial. No evidence of tricuspid stenosis. Aortic Valve: The aortic valve is normal in structure. Aortic valve regurgitation is not visualized. Mild aortic valve sclerosis is present, with no evidence of aortic valve stenosis. Aortic valve mean gradient measures 5.0 mmHg. Aortic valve peak gradient measures 8.5 mmHg. Aortic valve area, by VTI measures 2.52 cm. Pulmonic Valve: The pulmonic valve was normal in structure. Pulmonic valve regurgitation is not visualized. No evidence of pulmonic stenosis. Aorta: The aortic root is normal in size and structure. Venous: The inferior vena cava is normal in size  with greater than 50% respiratory variability, suggesting right atrial pressure of 3 mmHg. IAS/Shunts: No atrial level shunt detected by color flow Doppler.  LEFT VENTRICLE PLAX 2D LVIDd:         4.30 cm  Diastology LVIDs:         2.60 cm  LV e' medial:    5.98 cm/s LV PW:         1.05 cm  LV E/e' medial:  16.1 LV IVS:        0.89 cm  LV e' lateral:   5.87 cm/s LVOT diam:     2.00 cm  LV E/e' lateral: 16.4 LV SV:         69 LV SV Index:   38 LVOT Area:     3.14 cm  RIGHT VENTRICLE RV Basal diam:  2.82 cm RV S prime:     12.80 cm/s TAPSE (M-mode): 3.8 cm LEFT ATRIUM             Index       RIGHT ATRIUM           Index LA diam:        3.60 cm 1.99 cm/m  RA Area:     12.40 cm LA Vol (A2C):   65.2 ml 35.95 ml/m RA Volume:   30.50 ml  16.82 ml/m LA Vol (A4C):   37.7 ml 20.79 ml/m LA Biplane Vol: 54.2 ml 29.89 ml/m  AORTIC VALVE                    PULMONIC VALVE AV Area (Vmax):    2.41 cm     PV Vmax:        1.01 m/s AV Area (Vmean):   2.62 cm     PV Peak grad:   4.1 mmHg AV Area (VTI):     2.52 cm     RVOT Peak grad: 4 mmHg AV Vmax:           146.00 cm/s AV Vmean:          101.000 cm/s AV VTI:            0.274 m AV Peak Grad:      8.5 mmHg AV Mean Grad:      5.0 mmHg LVOT Vmax:         112.00 cm/s LVOT Vmean:        84.200 cm/s LVOT VTI:          0.220 m LVOT/AV VTI ratio: 0.80  AORTA Ao Root diam: 2.90 cm MITRAL VALVE                TRICUSPID VALVE MV Area (PHT): 5.38 cm     TR Peak grad:   28.1 mmHg MV  Decel Time: 141 msec     TR Vmax:        265.00 cm/s MV E velocity: 96.00 cm/s MV A velocity: 102.00 cm/s  SHUNTS MV E/A ratio:  0.94         Systemic VTI:  0.22 m                             Systemic Diam: 2.00 cm Neoma Laming MD Electronically signed by Neoma Laming MD Signature Date/Time: 02/11/2021/9:44:20 AM    Final      ASSESSMENT AND PLAN: Abnormal EKG with LVH throughout the precordial leads as well as the limb leads with significant LVH and asymmetrical T wave inversion.  Patient denies any chest  pain and has negative troponin.  Echocardiogram showed severe LVH with some eccentric apical thickening which could be due to apical hypertrophic cardiomyopathy.  But contrast echocardiogram may have been helpful but was not done.  May consider getting contrast echocardiogram if further evaluation is indicated.  Clinically patient looks fine to be discharged with follow-up in the office in 2 weeks.  Sophia Cubero A

## 2021-02-11 NOTE — Discharge Summary (Signed)
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 female with medical history significant for hypertension and hyperlipidemia presents to the emergency room for chief concern of bilateral shoulder pain that started on Tuesday.  Presentation is concerning for rheumatoid arthritis with positive RF.  Symptoms improved with low dose steroids.  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  Plan for outpatient cardiology follow up.  Discharge today with plan for outpatient follow up  Hospital Course:  Bilateral Should Pain and Stiffness  Positive Rheumatoid Factor  Concern for Rheumatoid Arthritis [place] shoulders wnl RA seems most likely at this time with positive RF.  Anti CCP is pending.  ANA is pending.  Polymyalgia rheumatica is on ddx.    RF positive.   CK wnl.  CRP and ESR elevated - rising today, clinically improved on steroids.  Follow outpatient. She'll need rheum follow up outpatient for final diagnosis and consideration of DMARD  Systemic Inflammatory Response Syndrome Fever, leukocytosis, tachy, tachypnea - no clear infectious source -> suspect rheumatologic  UA abnormal, but not concerning for infection Blood cultures NGTDx2 CT PE protocol without PE or acute pulm abnormalities Procal is up as well as inflammatory markers Leukocytosis, follow ? 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   Follow outpatient   Hypertenson HCTZ, amlodipine, losartan   HLD Lipitor  Thyroid Goiter - noted on imaging, follow outpatient  Procedures: Echo IMPRESSIONS     1. Left ventricular ejection fraction, by estimation, is 60 to 65%. The  left ventricle has normal function. The left ventricle has no regional  wall motion abnormalities. There is severe asymmetric left ventricular  hypertrophy of the apical segment. Left   ventricular diastolic parameters are consistent with Grade I diastolic  dysfunction (impaired relaxation).   2. Right ventricular systolic function is normal. The right ventricular  size is  normal.   3. The mitral valve is normal in structure. Trivial mitral valve  regurgitation. No evidence of mitral stenosis.   4. The aortic valve is normal in structure.  Aortic valve regurgitation is  not visualized. Mild aortic valve sclerosis is present, with no evidence  of aortic valve stenosis.   5. The inferior vena cava is normal in size with greater than 50%  respiratory variability, suggesting right atrial pressure of 3 mmHg.   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   Consultations: cardiology  Discharge Exam: Vitals:   02/11/21 0420 02/11/21 0859  BP: 123/68 134/64  Pulse: 91 84  Resp: 16 18  Temp: 98.6 F (37 C) 98.8 F (37.1 C)  SpO2: 97% 96%   Feels better after steroids Eager to discharge home  General: No acute distress. Cardiovascular: Heart sounds show Sage Hammill regular rate, and rhythm Lungs: Clear to auscultation bilaterally Abdomen: Soft, nontender, nondistended  Neurological: Alert and oriented 3. Moves all extremities 4 . Cranial nerves II through XII grossly intact. Skin: Warm and dry. No rashes or lesions. Extremities: No clubbing or cyanosis. No edema.  Discharge Instructions   Discharge Instructions     Ambulatory referral to Rheumatology   Complete by: As directed    Call MD for:  difficulty breathing, headache or visual disturbances   Complete by: As directed    Call MD for:  extreme fatigue   Complete by: As directed    Call MD for:  hives   Complete by: As directed    Call MD for:  persistant dizziness or light-headedness   Complete by: As directed    Call MD for:  persistant nausea and vomiting   Complete by: As directed    Call MD for:  redness, tenderness, or signs of infection (pain, swelling, redness, odor or green/yellow discharge around incision site)   Complete by: As directed    Call MD for:  severe uncontrolled pain   Complete by: As directed    Call MD for:  temperature >100.4   Complete by: As directed    Diet - low sodium heart healthy   Complete by: As directed    Discharge instructions   Complete by: As directed    You  were seen for bilateral shoulder pain.  You had elevated inflammatory markers.   I believe this may be due to rheumatoid arthritis.  Other things that could cause this are polymyalgia rheumatica, but one of your rheumatoid arthritis tests (the RF - rheumatoid factor) is positive, making rheumatoid arthritis most likely.  We've started you on low dose steroids.  Please follow up with your PCP in the next week to discuss rheumatology referral.  If the diagnosis of rheumatoid arthritis is confirmed, you'll need to start Keydi Giel disease modifying antirheumatic drug (DMARD).  Please follow up with your PCP and rheumatology for additional recommendations regarding the steroids, other medications, and final diagnosis.  You have pending labs at the time of discharge (pending ANA, anti CCP, pending blood cultures).  Please follow these results up with your PCP and rheumatologist.  You had an abnormal echocardiogram, CT scan, and EKG.  You were seen by cardiology who recommends outpatient follow up with them as scheduled.  You'll follow up Floreine Kingdon repeat echo outpatient with cardiology.   Your urine studies were abnormal and showed blood at times as well as protein at times.  Repeat this outpatient with your PCP.   You have Keary Hanak thyroid goiter.  This should be followed outpatient with your PCP.  Return for new, recurrent, or worsening symptoms.  Please ask your PCP to request records from this hospitalization so they know what was done and what the next steps will be.   Increase activity slowly   Complete by: As directed       Allergies as of 02/11/2021   No Known Allergies      Medication List     TAKE these medications    amLODipine 5 MG tablet Commonly known as: NORVASC Take 5 mg by mouth daily.   aspirin 81 MG EC tablet Take by mouth.   atorvastatin 20 MG tablet Commonly known as: LIPITOR Take 1 tablet by mouth at bedtime.   calcium-vitamin D 500-200 MG-UNIT Tabs tablet Commonly known as: OSCAL  WITH D Take 1 tablet by mouth daily.   Cholecalciferol 25 MCG (1000 UT) capsule Take by mouth.   fluticasone 50 MCG/ACT nasal spray Commonly known as: FLONASE Place into the nose.   hydrochlorothiazide 25 MG tablet Commonly known as: HYDRODIURIL Take 25 mg by mouth daily.   losartan 100 MG tablet Commonly known as: COZAAR Take 100 mg by mouth daily.   pantoprazole 40 MG tablet Commonly known as: PROTONIX Take 1 tablet (40 mg total) by mouth daily. Start taking on: February 12, 2021   predniSONE 5 MG tablet Commonly known as: DELTASONE Take 3 tablets (15 mg total) by mouth daily with breakfast for 14 days. Please follow up with your PCP and/or rheumatologist regarding need for additional steroid and/or starting Erskine Steinfeldt DMARD Start taking on: February 12, 2021       No Known Allergies  Follow-up Information     Perrin Maltese, MD. Schedule an appointment as soon as possible for Aundrea Higginbotham visit.   Specialty: Internal Medicine Contact information: [place] [address] [phone]                  The results of significant diagnostics from this hospitalization (including imaging, microbiology, ancillary and laboratory) are listed below for reference.    Significant Diagnostic Studies: DG Chest 2 View  Result Date: 02/09/2021 CLINICAL DATA:  Bilateral shoulder pain EXAM: CHEST - 2 VIEW COMPARISON:  None. FINDINGS: Cardiac shadow is within normal limits. Aortic calcifications are seen. The lungs are clear. No bony abnormality is noted. IMPRESSION: No active cardiopulmonary disease. Electronically Signed   By: Inez Catalina M.D.   On: 02/09/2021 15:01   DG Shoulder Right  Result Date: 02/09/2021 CLINICAL DATA:  Acute bilateral shoulder pain. EXAM: RIGHT SHOULDER - 2+ VIEW COMPARISON:  None. FINDINGS: There is no evidence of fracture or dislocation. There is no evidence of arthropathy or other focal bone abnormality. Soft tissues are unremarkable. IMPRESSION: Negative.  Electronically Signed   By: Marijo Conception M.D.   On: 02/09/2021 16:44   CT Angio Chest Pulmonary Embolism (PE) W or WO Contrast  Result Date: 02/10/2021 CLINICAL DATA:  Fever, shoulder pain, back pain, tachycardia, leukocytosis, sepsis and elevated D-dimer. EXAM: CT ANGIOGRAPHY CHEST WITH CONTRAST TECHNIQUE: Multidetector CT imaging of the chest was performed using the standard protocol during bolus administration of intravenous contrast. Multiplanar CT image reconstructions and MIPs were obtained to evaluate the vascular anatomy. CONTRAST:  21mL OMNIPAQUE IOHEXOL 350 MG/ML SOLN COMPARISON:  None. FINDINGS: Cardiovascular: The pulmonary arteries are well opacified. There is no evidence of pulmonary embolism. Central pulmonary arteries are normal in caliber. The heart size is within normal limits. No pericardial fluid  identified. 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 No evidence of visible thrombus at the level of the small apical aneurysm by CT. The thoracic aorta is normal in caliber. Mild calcified plaque is seen in the thoracic aorta. Mediastinum/Nodes: No lymphadenopathy identified. Mild enlargement of the thyroid gland is consistent with goiter. No mass effect on the airway. Small hiatal hernia. Lungs/Pleura: There is no evidence of pulmonary edema, consolidation, pneumothorax, nodule or pleural fluid. Upper Abdomen: No acute abnormality. Musculoskeletal: No chest wall abnormality. No acute or significant osseous findings. Review of the MIP images confirms the above findings. IMPRESSION: 1. No evidence of pulmonary embolism. 2. 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 Correlation with echocardiography may be helpful. 3. No acute pulmonary abnormalities. 4.  With mild thyroid enlargement consistent with goiter. 5. Small hiatal hernia. Electronically Signed   By: Aletta Edouard M.D.   On: 02/10/2021 15:52   [place] COMPLETE JOINT SPACE STRUCTURE UP LEFT  Result Date: 02/10/2021 CLINICAL DATA:  Shoulder pain EXAM: ULTRASOUND left UPPER EXTREMITY COMPLETE TECHNIQUE: Ultrasound examination was performed including evaluation of the muscles, tendons, joint, and adjacent soft tissues. COMPARISON:  None. FINDINGS: No fluid collection, mass, or other sonographic abnormality is identified. IMPRESSION: No sonographic abnormality identified along the left shoulder. Electronically Signed   By: Maurine Simmering   On: 02/10/2021 13:29   [place] COMPLETE JOINT SPACE STRUCTURES UP RIGHT  Result Date: 02/10/2021 CLINICAL DATA:  Bilateral shoulder pain for 3-4 days. No known injury. EXAM: ULTRASOUND RIGHT UPPER EXTREMITY COMPLETE TECHNIQUE: Ultrasound examination was performed including evaluation of the muscles, tendons, joint, and adjacent soft tissues. COMPARISON:  None. FINDINGS: No fluid collection, mass or other abnormality is identified. IMPRESSION: Negative exam. Electronically Signed   By: Inge Rise M.D.   On: 02/10/2021 13:25   DG Shoulder Left  Result Date: 02/09/2021 CLINICAL DATA:  Acute bilateral shoulder pain. EXAM: LEFT SHOULDER - 2+ VIEW COMPARISON:  None. FINDINGS: There is no evidence of fracture or dislocation. There is no evidence of arthropathy or other focal bone abnormality. Soft tissues are unremarkable. IMPRESSION: Negative. Electronically Signed   By: Marijo Conception M.D.   On: 02/09/2021 16:43   MM 3D SCREEN BREAST BILATERAL  Result Date: 02/07/2021 CLINICAL DATA:  Screening. EXAM: DIGITAL SCREENING BILATERAL MAMMOGRAM WITH TOMOSYNTHESIS AND CAD TECHNIQUE: Bilateral screening digital craniocaudal and mediolateral oblique mammograms were obtained. Bilateral screening digital breast tomosynthesis was performed. The images were evaluated with computer-aided  detection. COMPARISON:  Previous exam(s). ACR Breast Density Category b: There are scattered areas of fibroglandular density. FINDINGS: There are no findings suspicious for malignancy. IMPRESSION: No mammographic evidence of malignancy. Owin Vignola result letter of this screening mammogram will be mailed directly to the patient. RECOMMENDATION: Screening mammogram in one year. (Code:SM-B-01Y) BI-RADS CATEGORY  1: Negative. Electronically Signed   By: Lajean Manes M.D.   On: 02/07/2021 10:42  ECHOCARDIOGRAM COMPLETE  Result Date: 02/11/2021    ECHOCARDIOGRAM REPORT   Patient Name:   Kathryn Mcdowell Date of Exam: 02/10/2021 Medical Rec #:  [id number]       Height:       63.0 in Accession #:    [id number]      Weight:       172.0 lb Date of Birth:  March 02, 1951  BSA:          1.814 m Patient Age:    39 years        BP:           131/71 mmHg Patient Gender: F               HR:           89 bpm. Exam Location:  [facility] Procedure: 2D Echo, Cardiac Doppler and Color Doppler Indications:     Abnormal ECG R 94.31  History:         Patient has no prior history of Echocardiogram examinations.                  Risk Factors:Hypertension and Dyslipidemia.  Sonographer:     Sherrie Sport RDCS (AE) Referring Phys:  [place] Diagnosing Phys: Neoma Laming MD  Sonographer Comments: Suboptimal apical window. IMPRESSIONS  1. Left ventricular ejection fraction, by estimation, is 60 to 65%. The left ventricle has normal function. The left ventricle has no regional wall motion abnormalities. There is severe asymmetric left ventricular hypertrophy of the apical segment. Left  ventricular diastolic parameters are consistent with Grade I diastolic dysfunction (impaired relaxation).  2. Right ventricular systolic function is normal. The right ventricular size is normal.  3. The mitral valve is normal in structure. Trivial mitral valve regurgitation. No evidence of mitral stenosis.  4. The aortic valve is normal in structure. Aortic  valve regurgitation is not visualized. Mild aortic valve sclerosis is present, with no evidence of aortic valve stenosis.  5. The inferior vena cava is normal in size with greater than 50% respiratory variability, suggesting right atrial pressure of 3 mmHg. 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 FINDINGS  Left Ventricle: Left ventricular ejection fraction, by estimation, is 60 to 65%. The left ventricle has normal function. The left ventricle has no regional wall motion abnormalities. The left ventricular internal cavity size was normal in size. There is  severe asymmetric left ventricular hypertrophy of the apical segment. Left ventricular diastolic parameters are consistent with Grade I diastolic dysfunction (impaired relaxation). Right Ventricle: The right ventricular size is normal. No increase in right ventricular wall thickness. Right ventricular systolic function is normal. Left Atrium: Left atrial size was normal in size. Right Atrium: Right atrial size was normal in size. Pericardium: There is no evidence of pericardial effusion. Mitral Valve: The mitral valve is normal in structure. Trivial mitral valve regurgitation. No evidence of mitral valve stenosis. Tricuspid Valve: The tricuspid valve is normal in structure. Tricuspid valve regurgitation is trivial. No evidence of tricuspid stenosis. Aortic Valve: The aortic valve is normal in structure. Aortic valve regurgitation is not visualized. Mild aortic valve sclerosis is present, with no evidence of aortic valve stenosis. Aortic valve mean gradient measures 5.0 mmHg. Aortic valve peak gradient measures 8.5 mmHg. Aortic valve area, by VTI measures 2.52 cm. Pulmonic Valve: The pulmonic valve was normal in structure. Pulmonic valve regurgitation is not visualized. No evidence of pulmonic stenosis. Aorta: The aortic root is normal in size and structure. Venous: The inferior vena cava is normal in  size with greater than 50% respiratory variability, suggesting right atrial pressure of 3 mmHg. IAS/Shunts: No atrial level shunt detected by color flow Doppler.  LEFT VENTRICLE PLAX 2D LVIDd:         4.30 cm  Diastology LVIDs:         2.60 cm  LV e' medial:  5.98 cm/s LV PW:         1.05 cm  LV E/e' medial:  16.1 LV IVS:        0.89 cm  LV e' lateral:   5.87 cm/s LVOT diam:     2.00 cm  LV E/e' lateral: 16.4 LV SV:         69 LV SV Index:   38 LVOT Area:     3.14 cm  RIGHT VENTRICLE RV Basal diam:  2.82 cm RV S prime:     12.80 cm/s TAPSE (M-mode): 3.8 cm LEFT ATRIUM             Index       RIGHT ATRIUM           Index LA diam:        3.60 cm 1.99 cm/m  RA Area:     12.40 cm LA Vol (A2C):   65.2 ml 35.95 ml/m RA Volume:   30.50 ml  16.82 ml/m LA Vol (A4C):   37.7 ml 20.79 ml/m LA Biplane Vol: 54.2 ml 29.89 ml/m  AORTIC VALVE                    PULMONIC VALVE AV Area (Vmax):    2.41 cm     PV Vmax:        1.01 m/s AV Area (Vmean):   2.62 cm     PV Peak grad:   4.1 mmHg AV Area (VTI):     2.52 cm     RVOT Peak grad: 4 mmHg AV Vmax:           146.00 cm/s AV Vmean:          101.000 cm/s AV VTI:            0.274 m AV Peak Grad:      8.5 mmHg AV Mean Grad:      5.0 mmHg LVOT Vmax:         112.00 cm/s LVOT Vmean:        84.200 cm/s LVOT VTI:          0.220 m LVOT/AV VTI ratio: 0.80  AORTA Ao Root diam: 2.90 cm MITRAL VALVE                TRICUSPID VALVE MV Area (PHT): 5.38 cm     TR Peak grad:   28.1 mmHg MV Decel Time: 141 msec     TR Vmax:        265.00 cm/s MV E velocity: 96.00 cm/s MV Tyjae Issa velocity: 102.00 cm/s  SHUNTS MV E/Montez Stryker ratio:  0.94         Systemic VTI:  0.22 m                             Systemic Diam: 2.00 cm Neoma Laming MD Electronically signed by Neoma Laming MD Signature Date/Time: 02/11/2021/9:44:20 AM    Final     Microbiology: Recent Results (from the past 240 hour(s))  Resp Panel by RT-PCR (Flu Raye Wiens&B, Covid) Nasopharyngeal Swab     Status: None   Collection Time: 02/09/21  3:44 PM    Specimen: Nasopharyngeal Swab; Nasopharyngeal(NP) swabs in vial transport medium  Result Value Ref Range Status   SARS Coronavirus 2 by RT PCR NEGATIVE NEGATIVE Final    Comment: (NOTE) SARS-CoV-2 target nucleic acids are NOT DETECTED.  The SARS-CoV-2 RNA is generally detectable in  upper respiratory specimens during the acute phase of infection. The lowest concentration of SARS-CoV-2 viral copies this assay can detect is 138 copies/mL. Nakoa Ganus negative result does not preclude SARS-Cov-2 infection and should not be used as the sole basis for treatment or other patient management decisions. Deajah Erkkila negative result may occur with  improper specimen collection/handling, submission of specimen other than nasopharyngeal swab, presence of viral mutation(s) within the areas targeted by this assay, and inadequate number of viral copies(<138 copies/mL). Danesha Kirchoff negative result must be combined with clinical observations, patient history, and epidemiological information. The expected result is Negative.  Fact Sheet for Patients:  EntrepreneurPulse.com.au  Fact Sheet for Healthcare Providers:  IncredibleEmployment.be  This test is no t yet approved or cleared by the [place] FDA and  has been authorized for detection and/or diagnosis of SARS-CoV-2 by FDA under an Emergency Use Authorization (EUA). This EUA will remain  in effect (meaning this test can be used) for the duration of the COVID-19 declaration under Section 564(b)(1) of the Act, 21 U.S.C.section 360bbb-3(b)(1), unless the authorization is terminated  or revoked sooner.       Influenza Aeric Burnham by PCR NEGATIVE NEGATIVE Final   Influenza B by PCR NEGATIVE NEGATIVE Final    Comment: (NOTE) The Xpert Xpress SARS-CoV-2/FLU/RSV plus assay is intended as an aid in the diagnosis of influenza from Nasopharyngeal swab specimens and should not be used as Kellyann Ordway sole basis for treatment. Nasal washings and aspirates are  unacceptable for Xpert Xpress SARS-CoV-2/FLU/RSV testing.  Fact Sheet for Patients: EntrepreneurPulse.com.au  Fact Sheet for Healthcare Providers: IncredibleEmployment.be  This test is not yet approved or cleared by the [place] FDA and has been authorized for detection and/or diagnosis of SARS-CoV-2 by FDA under an Emergency Use Authorization (EUA). This EUA will remain in effect (meaning this test can be used) for the duration of the COVID-19 declaration under Section 564(b)(1) of the Act, 21 U.S.C. section 360bbb-3(b)(1), unless the authorization is terminated or revoked.  Performed at [facility], [address] [place], [place] [address]   Blood culture (routine x 2)     Status: None (Preliminary result)   Collection Time: 02/09/21  5:50 PM   Specimen: BLOOD  Result Value Ref Range Status   Specimen Description BLOOD BLOOD RIGHT HAND  Final   Special Requests   Final    BOTTLES DRAWN AEROBIC AND ANAEROBIC Blood Culture adequate volume   Culture   Final    NO GROWTH 2 DAYS Performed at [facility], [address], [place], [place] [address]    Report Status PENDING  Incomplete  Blood culture (routine x 2)     Status: None (Preliminary result)   Collection Time: 02/09/21  5:50 PM   Specimen: BLOOD  Result Value Ref Range Status   Specimen Description BLOOD BLOOD RIGHT ARM  Final   Special Requests   Final    BOTTLES DRAWN AEROBIC AND ANAEROBIC Blood Culture adequate volume   Culture   Final    NO GROWTH 2 DAYS Performed at [facility], [place], [place] [address]    Report Status PENDING  Incomplete     Labs: Basic Metabolic Panel: Recent Labs  Lab 02/09/21 1422 02/10/21 0739 02/11/21 0507  NA 126* 134* 137  K 3.4* 3.2* 3.5  CL 93* 103 106  CO2 21* 23 25  GLUCOSE 175* 120* 114*  BUN $Re'9 13 13  'Nov$ CREATININE 0.74 0.81 0.77  CALCIUM 9.3 8.8* 9.3  MG  --   --  2.0  PHOS   --   --  3.3   Liver Function Tests: Recent Labs  Lab 02/09/21 1422 02/11/21 0507  AST 43* 28  ALT 38 47*  ALKPHOS 55 66  BILITOT 1.3* 1.0  PROT 8.4* 7.4  ALBUMIN 4.2 3.2*   No results for input(s): LIPASE, AMYLASE in the last 168 hours. No results for input(s): AMMONIA in the last 168 hours. CBC: Recent Labs  Lab 02/09/21 1422 02/10/21 0739 02/11/21 0507  WBC 21.7* 16.8* 14.4*  NEUTROABS 18.7*  --  11.9*  HGB 11.7* 10.8* 10.3*  HCT 32.3* 30.5* 28.7*  MCV 92.8 94.7 92.3  PLT 247 237 251   Cardiac Enzymes: Recent Labs  Lab 02/10/21 0739  CKTOTAL 189   BNP: BNP (last 3 results) No results for input(s): BNP in the last 8760 hours.  ProBNP (last 3 results) No results for input(s): PROBNP in the last 8760 hours.  CBG: No results for input(s): GLUCAP in the last 168 hours.     Signed:  Fayrene Helper MD.  [facility] 02/11/2021, 11:49 AM

## 2021-02-14 LAB — CULTURE, BLOOD (ROUTINE X 2)
Culture: NO GROWTH
Culture: NO GROWTH
Special Requests: ADEQUATE
Special Requests: ADEQUATE

## 2021-02-15 LAB — CYCLIC CITRUL PEPTIDE ANTIBODY, IGG/IGA: CCP Antibodies IgG/IgA: 183 units — ABNORMAL HIGH (ref 0–19)

## 2021-02-15 LAB — ANA W/REFLEX: Anti Nuclear Antibody (ANA): NEGATIVE

## 2021-02-16 DIAGNOSIS — E782 Mixed hyperlipidemia: Secondary | ICD-10-CM | POA: Diagnosis not present

## 2021-02-16 DIAGNOSIS — M069 Rheumatoid arthritis, unspecified: Secondary | ICD-10-CM | POA: Diagnosis not present

## 2021-02-16 DIAGNOSIS — I10 Essential (primary) hypertension: Secondary | ICD-10-CM | POA: Diagnosis not present

## 2021-03-09 DIAGNOSIS — M0579 Rheumatoid arthritis with rheumatoid factor of multiple sites without organ or systems involvement: Secondary | ICD-10-CM | POA: Diagnosis not present

## 2021-03-09 DIAGNOSIS — R809 Proteinuria, unspecified: Secondary | ICD-10-CM | POA: Diagnosis not present

## 2021-03-09 DIAGNOSIS — R700 Elevated erythrocyte sedimentation rate: Secondary | ICD-10-CM | POA: Diagnosis not present

## 2021-03-09 DIAGNOSIS — D72829 Elevated white blood cell count, unspecified: Secondary | ICD-10-CM | POA: Diagnosis not present

## 2021-03-16 DIAGNOSIS — M069 Rheumatoid arthritis, unspecified: Secondary | ICD-10-CM | POA: Diagnosis not present

## 2021-03-16 DIAGNOSIS — R7302 Impaired glucose tolerance (oral): Secondary | ICD-10-CM | POA: Diagnosis not present

## 2021-03-16 DIAGNOSIS — I10 Essential (primary) hypertension: Secondary | ICD-10-CM | POA: Diagnosis not present

## 2021-03-16 DIAGNOSIS — E782 Mixed hyperlipidemia: Secondary | ICD-10-CM | POA: Diagnosis not present

## 2021-03-23 DIAGNOSIS — M0579 Rheumatoid arthritis with rheumatoid factor of multiple sites without organ or systems involvement: Secondary | ICD-10-CM | POA: Diagnosis not present

## 2021-03-23 DIAGNOSIS — D472 Monoclonal gammopathy: Secondary | ICD-10-CM | POA: Diagnosis not present

## 2021-03-23 DIAGNOSIS — D649 Anemia, unspecified: Secondary | ICD-10-CM | POA: Diagnosis not present

## 2021-04-03 DIAGNOSIS — M069 Rheumatoid arthritis, unspecified: Secondary | ICD-10-CM | POA: Diagnosis not present

## 2021-04-03 DIAGNOSIS — Z79899 Other long term (current) drug therapy: Secondary | ICD-10-CM | POA: Diagnosis not present

## 2021-04-24 DIAGNOSIS — D472 Monoclonal gammopathy: Secondary | ICD-10-CM | POA: Diagnosis not present

## 2021-04-24 DIAGNOSIS — M0579 Rheumatoid arthritis with rheumatoid factor of multiple sites without organ or systems involvement: Secondary | ICD-10-CM | POA: Diagnosis not present

## 2021-04-24 DIAGNOSIS — M25562 Pain in left knee: Secondary | ICD-10-CM | POA: Diagnosis not present

## 2021-04-27 DIAGNOSIS — E559 Vitamin D deficiency, unspecified: Secondary | ICD-10-CM | POA: Diagnosis not present

## 2021-04-27 DIAGNOSIS — E782 Mixed hyperlipidemia: Secondary | ICD-10-CM | POA: Diagnosis not present

## 2021-04-27 DIAGNOSIS — I10 Essential (primary) hypertension: Secondary | ICD-10-CM | POA: Diagnosis not present

## 2021-04-27 DIAGNOSIS — R7302 Impaired glucose tolerance (oral): Secondary | ICD-10-CM | POA: Diagnosis not present

## 2021-04-27 DIAGNOSIS — M069 Rheumatoid arthritis, unspecified: Secondary | ICD-10-CM | POA: Diagnosis not present

## 2021-04-28 ENCOUNTER — Ambulatory Visit: Payer: Medicare Other | Admitting: Internal Medicine

## 2021-05-12 DIAGNOSIS — I10 Essential (primary) hypertension: Secondary | ICD-10-CM | POA: Diagnosis not present

## 2021-05-12 DIAGNOSIS — E782 Mixed hyperlipidemia: Secondary | ICD-10-CM | POA: Diagnosis not present

## 2021-05-12 DIAGNOSIS — M069 Rheumatoid arthritis, unspecified: Secondary | ICD-10-CM | POA: Diagnosis not present

## 2021-05-12 DIAGNOSIS — Z23 Encounter for immunization: Secondary | ICD-10-CM | POA: Diagnosis not present

## 2021-05-12 DIAGNOSIS — E119 Type 2 diabetes mellitus without complications: Secondary | ICD-10-CM | POA: Diagnosis not present

## 2021-06-23 DIAGNOSIS — M0579 Rheumatoid arthritis with rheumatoid factor of multiple sites without organ or systems involvement: Secondary | ICD-10-CM | POA: Diagnosis not present

## 2021-07-03 DIAGNOSIS — D472 Monoclonal gammopathy: Secondary | ICD-10-CM | POA: Diagnosis not present

## 2021-07-03 DIAGNOSIS — M0579 Rheumatoid arthritis with rheumatoid factor of multiple sites without organ or systems involvement: Secondary | ICD-10-CM | POA: Diagnosis not present

## 2021-07-18 DIAGNOSIS — M79641 Pain in right hand: Secondary | ICD-10-CM | POA: Diagnosis not present

## 2021-07-18 DIAGNOSIS — M0579 Rheumatoid arthritis with rheumatoid factor of multiple sites without organ or systems involvement: Secondary | ICD-10-CM | POA: Diagnosis not present

## 2021-07-27 DIAGNOSIS — M069 Rheumatoid arthritis, unspecified: Secondary | ICD-10-CM | POA: Diagnosis not present

## 2021-07-27 DIAGNOSIS — I10 Essential (primary) hypertension: Secondary | ICD-10-CM | POA: Diagnosis not present

## 2021-07-27 DIAGNOSIS — E782 Mixed hyperlipidemia: Secondary | ICD-10-CM | POA: Diagnosis not present

## 2021-07-27 DIAGNOSIS — E119 Type 2 diabetes mellitus without complications: Secondary | ICD-10-CM | POA: Diagnosis not present

## 2021-08-02 IMAGING — MG MM DIGITAL SCREENING BILAT W/ TOMO AND CAD
8 series · 8 of 24 positions shown · non-contrast
Comparison: Previous exam(s).

CLINICAL DATA: Screening.

EXAM:
DIGITAL SCREENING BILATERAL MAMMOGRAM WITH TOMOSYNTHESIS AND CAD
TECHNIQUE: Bilateral screening digital craniocaudal and mediolateral oblique
mammograms were obtained. Bilateral screening digital breast
tomosynthesis was performed. The images were evaluated with
computer-aided detection.

[R MLO synth-2D]
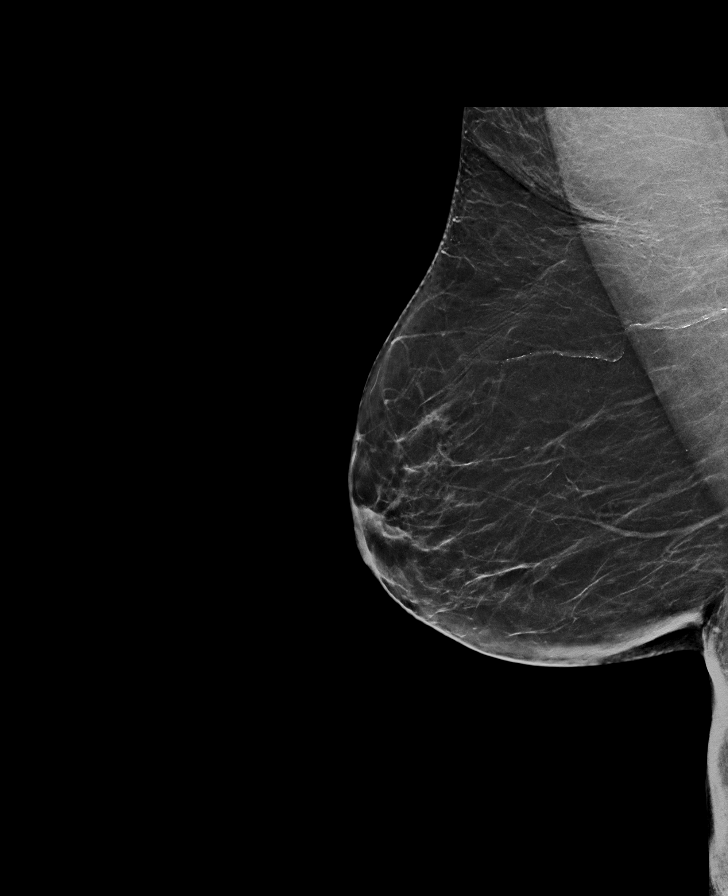

[L MLO synth-2D]
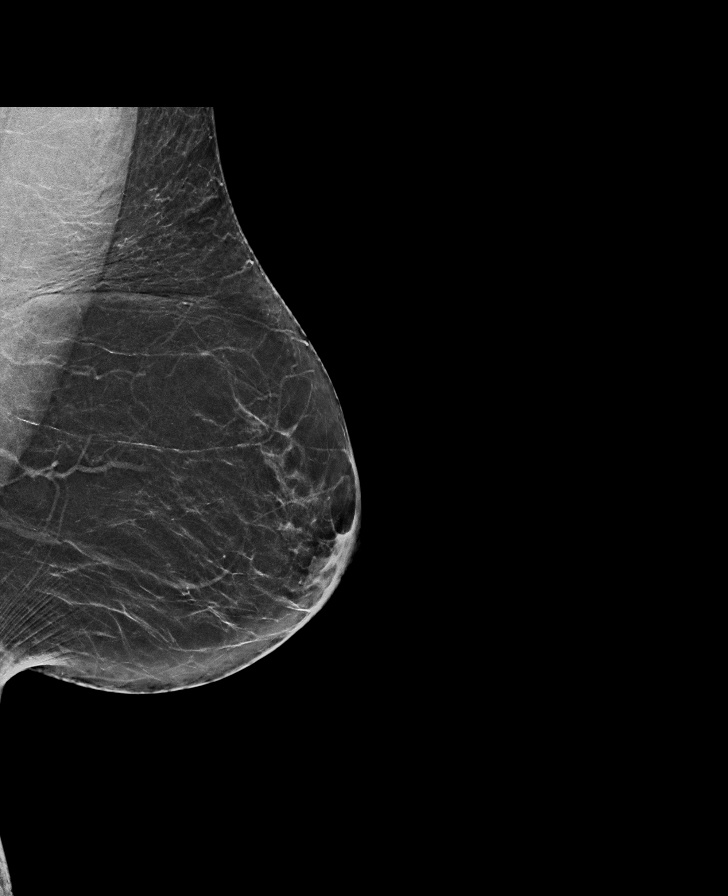

[L CC synth-2D]
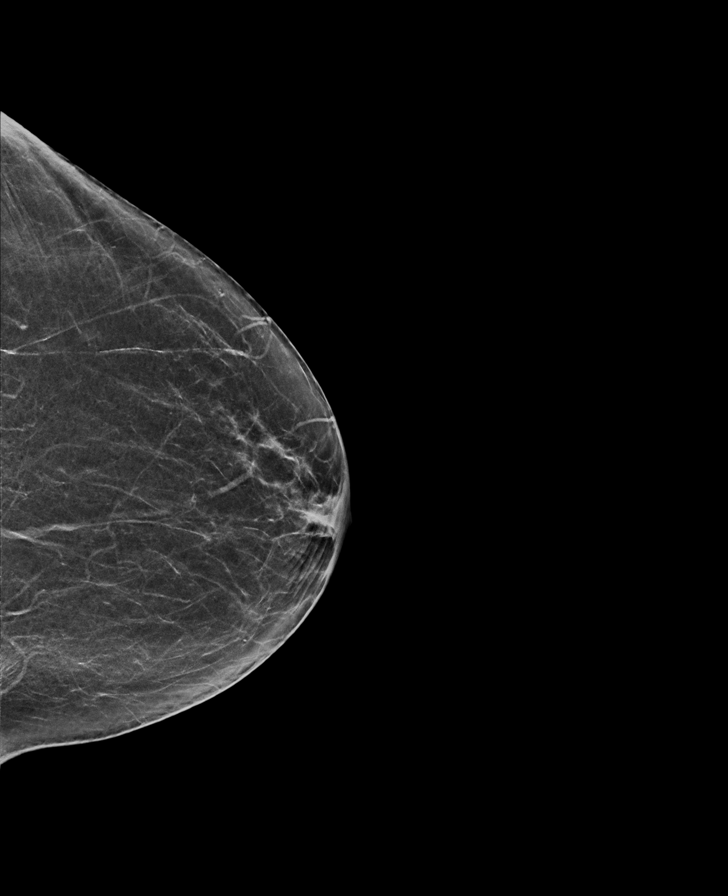

[R CC synth-2D]
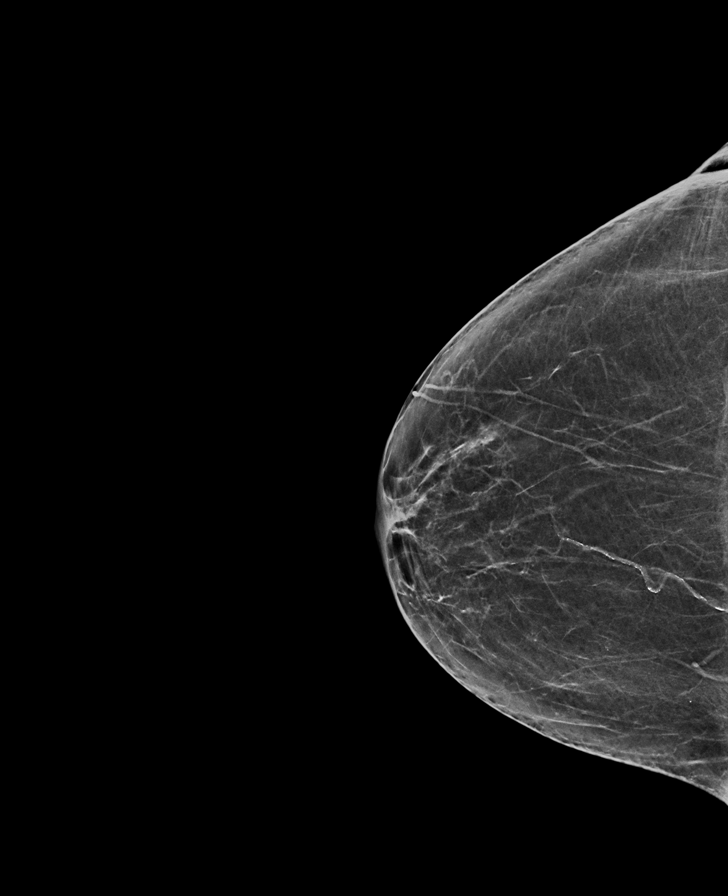

[R MLO tomo · tomo slice 35/68.0]
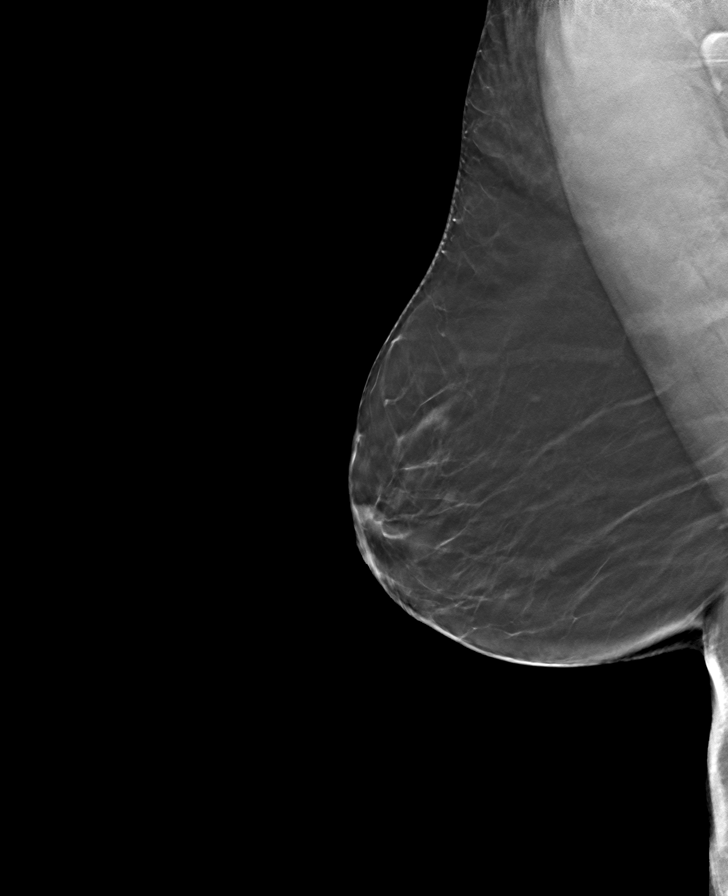

[L MLO tomo · tomo slice 34/67.0]
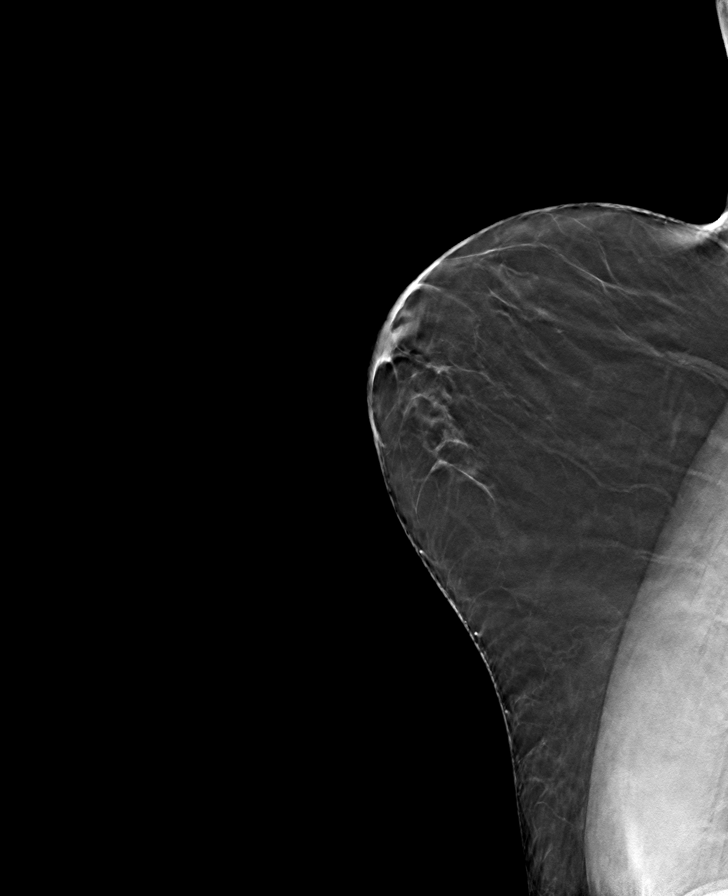

[L CC tomo · tomo slice 29/57.0]
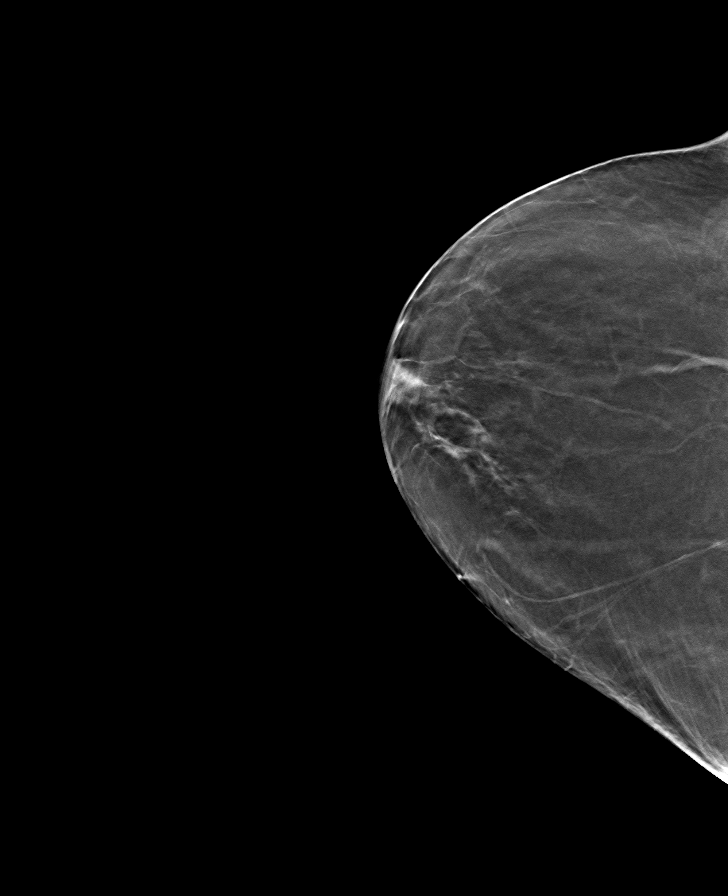

[R CC tomo · tomo slice 31/60.0]
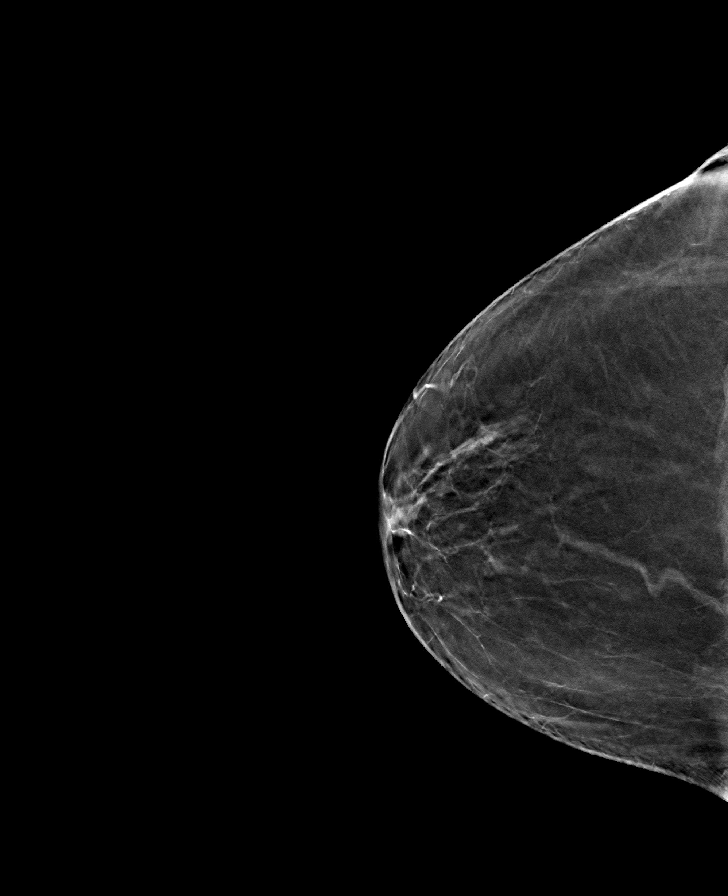

[8 of 24 positions shown; findings below may reference images not displayed]

ACR Breast Density Category b: There are scattered areas of
fibroglandular density.
FINDINGS: There are no findings suspicious for malignancy.
IMPRESSION: No mammographic evidence of malignancy. A result letter of this
screening mammogram will be mailed directly to the patient.

RECOMMENDATION:
Screening mammogram in one year. (Code:51-O-LD2)

BI-RADS CATEGORY  1: Negative.

## 2021-08-17 DIAGNOSIS — M0579 Rheumatoid arthritis with rheumatoid factor of multiple sites without organ or systems involvement: Secondary | ICD-10-CM | POA: Diagnosis not present

## 2021-09-04 DIAGNOSIS — D472 Monoclonal gammopathy: Secondary | ICD-10-CM | POA: Diagnosis not present

## 2021-09-04 DIAGNOSIS — M0579 Rheumatoid arthritis with rheumatoid factor of multiple sites without organ or systems involvement: Secondary | ICD-10-CM | POA: Diagnosis not present

## 2021-10-24 DIAGNOSIS — E782 Mixed hyperlipidemia: Secondary | ICD-10-CM | POA: Diagnosis not present

## 2021-10-24 DIAGNOSIS — E119 Type 2 diabetes mellitus without complications: Secondary | ICD-10-CM | POA: Diagnosis not present

## 2021-10-24 DIAGNOSIS — I10 Essential (primary) hypertension: Secondary | ICD-10-CM | POA: Diagnosis not present

## 2021-10-26 DIAGNOSIS — I10 Essential (primary) hypertension: Secondary | ICD-10-CM | POA: Diagnosis not present

## 2021-10-26 DIAGNOSIS — E119 Type 2 diabetes mellitus without complications: Secondary | ICD-10-CM | POA: Diagnosis not present

## 2021-10-26 DIAGNOSIS — E782 Mixed hyperlipidemia: Secondary | ICD-10-CM | POA: Diagnosis not present

## 2021-10-26 DIAGNOSIS — M069 Rheumatoid arthritis, unspecified: Secondary | ICD-10-CM | POA: Diagnosis not present

## 2021-11-02 DIAGNOSIS — M0579 Rheumatoid arthritis with rheumatoid factor of multiple sites without organ or systems involvement: Secondary | ICD-10-CM | POA: Diagnosis not present

## 2021-11-02 DIAGNOSIS — D472 Monoclonal gammopathy: Secondary | ICD-10-CM | POA: Diagnosis not present

## 2022-03-20 ENCOUNTER — Ambulatory Visit: Payer: Self-pay | Admitting: *Deleted

## 2022-03-20 NOTE — Patient Outreach (Signed)
  Care Coordination   03/20/2022 Name: Kathryn Mcdowell MRN: [id number] DOB: 22-Sep-1950   Care Coordination Outreach Attempts:  An unsuccessful telephone outreach was attempted today to offer the patient information about available care coordination services as a benefit of their health plan.   Follow Up Plan:  Additional outreach attempts will be made to offer the patient care coordination information and services.   Encounter Outcome:  No Answer  Care Coordination Interventions Activated:  No   Care Coordination Interventions:  No, not indicated    Hubert Azure RN, MSN RN Care Management Coordinator  [name] [phone] [email or web address]

## 2022-04-05 DIAGNOSIS — H43822 Vitreomacular adhesion, left eye: Secondary | ICD-10-CM | POA: Diagnosis not present

## 2022-04-12 DIAGNOSIS — I340 Nonrheumatic mitral (valve) insufficiency: Secondary | ICD-10-CM | POA: Diagnosis not present

## 2022-04-12 DIAGNOSIS — E559 Vitamin D deficiency, unspecified: Secondary | ICD-10-CM | POA: Diagnosis not present

## 2022-04-12 DIAGNOSIS — E782 Mixed hyperlipidemia: Secondary | ICD-10-CM | POA: Diagnosis not present

## 2022-04-12 DIAGNOSIS — E119 Type 2 diabetes mellitus without complications: Secondary | ICD-10-CM | POA: Diagnosis not present

## 2022-04-12 DIAGNOSIS — I10 Essential (primary) hypertension: Secondary | ICD-10-CM | POA: Diagnosis not present

## 2022-04-13 ENCOUNTER — Other Ambulatory Visit: Payer: Self-pay | Admitting: Internal Medicine

## 2022-04-13 DIAGNOSIS — Z1231 Encounter for screening mammogram for malignant neoplasm of breast: Secondary | ICD-10-CM

## 2022-04-20 DIAGNOSIS — M0579 Rheumatoid arthritis with rheumatoid factor of multiple sites without organ or systems involvement: Secondary | ICD-10-CM | POA: Diagnosis not present

## 2022-04-20 DIAGNOSIS — Z79899 Other long term (current) drug therapy: Secondary | ICD-10-CM | POA: Diagnosis not present

## 2022-04-27 DIAGNOSIS — E119 Type 2 diabetes mellitus without complications: Secondary | ICD-10-CM | POA: Diagnosis not present

## 2022-04-27 DIAGNOSIS — I10 Essential (primary) hypertension: Secondary | ICD-10-CM | POA: Diagnosis not present

## 2022-04-27 DIAGNOSIS — Z0000 Encounter for general adult medical examination without abnormal findings: Secondary | ICD-10-CM | POA: Diagnosis not present

## 2022-04-27 DIAGNOSIS — Z0001 Encounter for general adult medical examination with abnormal findings: Secondary | ICD-10-CM | POA: Diagnosis not present

## 2022-04-27 DIAGNOSIS — Z1389 Encounter for screening for other disorder: Secondary | ICD-10-CM | POA: Diagnosis not present

## 2022-04-27 DIAGNOSIS — M069 Rheumatoid arthritis, unspecified: Secondary | ICD-10-CM | POA: Diagnosis not present

## 2022-04-27 DIAGNOSIS — E782 Mixed hyperlipidemia: Secondary | ICD-10-CM | POA: Diagnosis not present

## 2022-05-08 ENCOUNTER — Telehealth: Payer: Self-pay

## 2022-05-08 NOTE — Patient Outreach (Signed)
  Care Coordination   05/08/2022 Name: SAFIATOU ISLAM MRN: [id number] DOB: 1951/07/09   Care Coordination Outreach Attempts:  Patient states she did not have time for call today.   Follow Up Plan:  Additional outreach attempts will be made to offer the patient care coordination information and services.   Encounter Outcome:  Pt. Refused  Care Coordination Interventions Activated:  No   Care Coordination Interventions:  No, not indicated    Quinn Plowman [facility] [place] [phone] direct line

## 2022-06-13 ENCOUNTER — Ambulatory Visit
Admission: RE | Admit: 2022-06-13 | Discharge: 2022-06-13 | Disposition: A | Discharge status: Home / Self Care | Payer: Medicare Other | Source: Ambulatory Visit | Attending: Internal Medicine | Admitting: Internal Medicine

## 2022-06-13 DIAGNOSIS — Z1231 Encounter for screening mammogram for malignant neoplasm of breast: Secondary | ICD-10-CM | POA: Insufficient documentation

## 2022-08-17 ENCOUNTER — Telehealth: Payer: Self-pay

## 2022-08-17 NOTE — Patient Outreach (Signed)
  Care Coordination   08/17/2022 Name: Kathryn Mcdowell MRN: [id number] DOB: 10-28-1950   Care Coordination Outreach Attempts:  Successful telephone outreach made to patient. Attempted to discuss and offer care coordination services. Patient states she is not interested.    Follow Up Plan:  No further outreach attempts will be made at this time. We have been unable to contact the patient to offer or enroll patient in care coordination services  Encounter Outcome:  Pt. Refused   Care Coordination Interventions:  No, not indicated    Quinn Plowman [facility] [place] [phone] direct line

## 2022-08-20 DIAGNOSIS — M0579 Rheumatoid arthritis with rheumatoid factor of multiple sites without organ or systems involvement: Secondary | ICD-10-CM | POA: Diagnosis not present

## 2022-08-20 DIAGNOSIS — D472 Monoclonal gammopathy: Secondary | ICD-10-CM | POA: Diagnosis not present

## 2022-08-20 DIAGNOSIS — Z79899 Other long term (current) drug therapy: Secondary | ICD-10-CM | POA: Diagnosis not present

## 2022-08-29 ENCOUNTER — Ambulatory Visit: Admit: 2022-08-29 | Payer: Medicare Other | Admitting: Ophthalmology

## 2022-08-29 SURGERY — PHACOEMULSIFICATION, CATARACT, WITH IOL INSERTION
Anesthesia: Topical | Laterality: Right

## 2022-09-12 ENCOUNTER — Ambulatory Visit: Admit: 2022-09-12 | Payer: Medicare Other | Admitting: Ophthalmology

## 2022-09-12 SURGERY — PHACOEMULSIFICATION, CATARACT, WITH IOL INSERTION
Anesthesia: Topical | Laterality: Left

## 2022-10-15 DIAGNOSIS — D369 Benign neoplasm, unspecified site: Secondary | ICD-10-CM | POA: Diagnosis not present

## 2022-10-15 DIAGNOSIS — R7989 Other specified abnormal findings of blood chemistry: Secondary | ICD-10-CM | POA: Diagnosis not present

## 2022-10-15 DIAGNOSIS — E119 Type 2 diabetes mellitus without complications: Secondary | ICD-10-CM | POA: Diagnosis not present

## 2022-10-15 DIAGNOSIS — E1169 Type 2 diabetes mellitus with other specified complication: Secondary | ICD-10-CM | POA: Diagnosis not present

## 2022-10-15 DIAGNOSIS — M069 Rheumatoid arthritis, unspecified: Secondary | ICD-10-CM | POA: Diagnosis not present

## 2022-10-15 DIAGNOSIS — E782 Mixed hyperlipidemia: Secondary | ICD-10-CM | POA: Diagnosis not present

## 2022-10-15 DIAGNOSIS — F32A Depression, unspecified: Secondary | ICD-10-CM | POA: Diagnosis not present

## 2022-10-15 DIAGNOSIS — E785 Hyperlipidemia, unspecified: Secondary | ICD-10-CM | POA: Diagnosis not present

## 2022-10-15 DIAGNOSIS — Z79899 Other long term (current) drug therapy: Secondary | ICD-10-CM | POA: Diagnosis not present

## 2022-10-15 DIAGNOSIS — Z0000 Encounter for general adult medical examination without abnormal findings: Secondary | ICD-10-CM | POA: Diagnosis not present

## 2023-04-19 ENCOUNTER — Other Ambulatory Visit: Payer: Self-pay | Admitting: Internal Medicine

## 2023-05-15 ENCOUNTER — Encounter: Payer: Self-pay | Admitting: Ophthalmology

## 2023-05-17 ENCOUNTER — Ambulatory Visit: Payer: Medicare Other

## 2023-05-17 ENCOUNTER — Encounter: Payer: Self-pay | Admitting: Ophthalmology

## 2023-05-17 DIAGNOSIS — Z860101 Personal history of adenomatous and serrated colon polyps: Secondary | ICD-10-CM | POA: Diagnosis not present

## 2023-05-17 DIAGNOSIS — Z09 Encounter for follow-up examination after completed treatment for conditions other than malignant neoplasm: Secondary | ICD-10-CM | POA: Diagnosis present

## 2023-05-17 DIAGNOSIS — K5730 Diverticulosis of large intestine without perforation or abscess without bleeding: Secondary | ICD-10-CM | POA: Diagnosis not present

## 2023-05-20 NOTE — Discharge Instructions (Signed)

## 2023-05-22 ENCOUNTER — Ambulatory Visit: Payer: Medicare Other | Admitting: Anesthesiology

## 2023-05-22 ENCOUNTER — Encounter: Payer: Self-pay | Admitting: Ophthalmology

## 2023-05-22 ENCOUNTER — Other Ambulatory Visit: Payer: Self-pay

## 2023-05-22 ENCOUNTER — Ambulatory Visit
Admission: RE | Admit: 2023-05-22 | Discharge: 2023-05-22 | Disposition: A | Discharge status: Home / Self Care | Payer: Medicare Other | Attending: Ophthalmology | Admitting: Ophthalmology

## 2023-05-22 ENCOUNTER — Encounter
Admission: RE | Disposition: A | Discharge status: Home / Self Care | Payer: Self-pay | Source: Home / Self Care | Attending: Ophthalmology

## 2023-05-22 DIAGNOSIS — E782 Mixed hyperlipidemia: Secondary | ICD-10-CM | POA: Insufficient documentation

## 2023-05-22 DIAGNOSIS — Z87891 Personal history of nicotine dependence: Secondary | ICD-10-CM | POA: Insufficient documentation

## 2023-05-22 DIAGNOSIS — K219 Gastro-esophageal reflux disease without esophagitis: Secondary | ICD-10-CM | POA: Insufficient documentation

## 2023-05-22 DIAGNOSIS — E1136 Type 2 diabetes mellitus with diabetic cataract: Secondary | ICD-10-CM | POA: Diagnosis present

## 2023-05-22 DIAGNOSIS — E785 Hyperlipidemia, unspecified: Secondary | ICD-10-CM | POA: Diagnosis not present

## 2023-05-22 DIAGNOSIS — M069 Rheumatoid arthritis, unspecified: Secondary | ICD-10-CM | POA: Diagnosis not present

## 2023-05-22 DIAGNOSIS — H2511 Age-related nuclear cataract, right eye: Secondary | ICD-10-CM | POA: Insufficient documentation

## 2023-05-22 DIAGNOSIS — I10 Essential (primary) hypertension: Secondary | ICD-10-CM | POA: Diagnosis not present

## 2023-05-22 DIAGNOSIS — H5703 Miosis: Secondary | ICD-10-CM | POA: Diagnosis not present

## 2023-05-22 HISTORY — DX: Rheumatoid arthritis, unspecified: M06.9

## 2023-05-22 HISTORY — PX: CATARACT EXTRACTION W/PHACO: SHX586

## 2023-05-22 HISTORY — DX: Presence of dental prosthetic device (complete) (partial): Z97.2

## 2023-05-22 HISTORY — DX: Gastro-esophageal reflux disease without esophagitis: K21.9

## 2023-05-22 HISTORY — DX: Type 2 diabetes mellitus with other specified complication: E11.69

## 2023-05-22 SURGERY — PHACOEMULSIFICATION, CATARACT, WITH IOL INSERTION
Anesthesia: Monitor Anesthesia Care | Site: Eye | Laterality: Right

## 2023-05-22 MED ORDER — TETRACAINE HCL 0.5 % OP SOLN
OPHTHALMIC | Status: AC
  Filled 2023-05-22: qty 4

## 2023-05-22 MED ORDER — SIGHTPATH DOSE#1 BSS IO SOLN
INTRAOCULAR | Status: DC | PRN
  Administered 2023-05-22: 15 mL

## 2023-05-22 MED ORDER — SIGHTPATH DOSE#1 NA HYALUR & NA CHOND-NA HYALUR IO KIT
PACK | INTRAOCULAR | Status: DC | PRN
  Administered 2023-05-22: 1 via OPHTHALMIC

## 2023-05-22 MED ORDER — SIGHTPATH DOSE#1 BSS IO SOLN
INTRAOCULAR | Status: DC | PRN
  Administered 2023-05-22: 1 mL

## 2023-05-22 MED ORDER — FENTANYL CITRATE (PF) 100 MCG/2ML IJ SOLN
INTRAMUSCULAR | Status: AC
  Filled 2023-05-22: qty 2

## 2023-05-22 MED ORDER — FENTANYL CITRATE (PF) 100 MCG/2ML IJ SOLN
INTRAMUSCULAR | Status: DC | PRN
  Administered 2023-05-22 (×2): 50 ug via INTRAVENOUS

## 2023-05-22 MED ORDER — ARMC OPHTHALMIC DILATING DROPS
OPHTHALMIC | Status: AC
  Filled 2023-05-22: qty 0.5

## 2023-05-22 MED ORDER — MIDAZOLAM HCL 2 MG/2ML IJ SOLN
INTRAMUSCULAR | Status: DC | PRN
  Administered 2023-05-22 (×2): 1 mg via INTRAVENOUS

## 2023-05-22 MED ORDER — SODIUM CHLORIDE 0.9% FLUSH: 10.0000 mL | INTRAVENOUS | Status: DC | PRN

## 2023-05-22 MED ORDER — ARMC OPHTHALMIC DILATING DROPS
1.0000 | OPHTHALMIC | Status: DC | PRN
  Administered 2023-05-22 (×3): 1 via OPHTHALMIC

## 2023-05-22 MED ORDER — BRIMONIDINE TARTRATE-TIMOLOL 0.2-0.5 % OP SOLN
OPHTHALMIC | Status: DC | PRN
  Administered 2023-05-22: 1 [drp] via OPHTHALMIC

## 2023-05-22 MED ORDER — MIDAZOLAM HCL 2 MG/2ML IJ SOLN
INTRAMUSCULAR | Status: AC
  Filled 2023-05-22: qty 2

## 2023-05-22 MED ORDER — LACTATED RINGERS IV SOLN: INTRAVENOUS | Status: DC

## 2023-05-22 MED ORDER — SIGHTPATH DOSE#1 BSS IO SOLN
INTRAOCULAR | Status: DC | PRN
  Administered 2023-05-22: 68 mL via OPHTHALMIC

## 2023-05-22 MED ORDER — TETRACAINE HCL 0.5 % OP SOLN
1.0000 [drp] | OPHTHALMIC | Status: DC | PRN
  Administered 2023-05-22 (×3): 1 [drp] via OPHTHALMIC

## 2023-05-22 MED ORDER — CEFUROXIME OPHTHALMIC INJECTION 1 MG/0.1 ML
INJECTION | OPHTHALMIC | Status: DC | PRN
  Administered 2023-05-22: .1 mL via INTRACAMERAL

## 2023-05-22 SURGICAL SUPPLY — 10 items
CATARACT SUITE SIGHTPATH (MISCELLANEOUS) ×1 IMPLANT
FEE CATARACT SUITE SIGHTPATH (MISCELLANEOUS) ×1 IMPLANT
GLOVE SRG 8 PF TXTR STRL LF DI (GLOVE) ×1 IMPLANT
GLOVE SURG ENC TEXT LTX SZ7.5 (GLOVE) ×1 IMPLANT
GLOVE SURG UNDER POLY LF SZ8 (GLOVE) ×1
LENS IOL TECNIS EYHANCE 20.5 (Intraocular Lens) IMPLANT
NDL FILTER BLUNT 18X1 1/2 (NEEDLE) ×1 IMPLANT
NEEDLE FILTER BLUNT 18X1 1/2 (NEEDLE) ×1 IMPLANT
RING MALYGIN 7.0 (MISCELLANEOUS) IMPLANT
SYR 3ML LL SCALE MARK (SYRINGE) ×1 IMPLANT

## 2023-05-22 NOTE — Anesthesia Postprocedure Evaluation (Signed)
Anesthesia Post Note  Patient: Kathryn Mcdowell  Procedure(s) Performed: CATARACT EXTRACTION PHACO AND INTRAOCULAR LENS PLACEMENT (IOC) RIGHT DIABETIC (Right: Eye)  Patient location during evaluation: PACU Anesthesia Type: MAC Level of consciousness: awake and alert Pain management: pain level controlled Vital Signs Assessment: post-procedure vital signs reviewed and stable Respiratory status: spontaneous breathing, nonlabored ventilation, respiratory function stable and patient connected to nasal cannula oxygen Cardiovascular status: stable and blood pressure returned to baseline Postop Assessment: no apparent nausea or vomiting Anesthetic complications: no   No notable events documented.   Last Vitals:  Vitals:   05/22/23 1135 05/22/23 1140  BP: 133/72 (!) 140/73  Pulse: 80 82  Resp: 14 16  Temp: (!) 36.2 C (!) 36.2 C  SpO2: 97% 96%    Last Pain:  Vitals:   05/22/23 1140  TempSrc:   PainSc: 0-No pain                 Jonesha Tsuchiya C Soraida Vickers

## 2023-05-22 NOTE — Transfer of Care (Signed)
Immediate Anesthesia Transfer of Care Note  Patient: Kathryn Mcdowell  Procedure(s) Performed: CATARACT EXTRACTION PHACO AND INTRAOCULAR LENS PLACEMENT (IOC) RIGHT DIABETIC (Right: Eye)  Patient Location: PACU  Anesthesia Type: MAC  Level of Consciousness: awake, alert  and patient cooperative  Airway and Oxygen Therapy: Patient Spontanous Breathing and Patient connected to supplemental oxygen  Post-op Assessment: Post-op Vital signs reviewed, Patient's Cardiovascular Status Stable, Respiratory Function Stable, Patent Airway and No signs of Nausea or vomiting  Post-op Vital Signs: Reviewed and stable  Complications: No notable events documented.

## 2023-05-22 NOTE — Anesthesia Preprocedure Evaluation (Addendum)
Anesthesia Evaluation  Patient identified by MRN, date of birth, ID band Patient awake    Reviewed: Allergy & Precautions, H&P , NPO status , Patient's Chart, lab work & pertinent test results  Airway Mallampati: IV  TM Distance: <3 FB Neck ROM: Full  Mouth opening: Limited Mouth Opening  Dental no notable dental hx. (+) Edentulous Lower, Edentulous Upper   Pulmonary former smoker   Pulmonary exam normal breath sounds clear to auscultation       Cardiovascular hypertension, Normal cardiovascular exam Rhythm:Regular Rate:Normal     Neuro/Psych negative neurological ROS  negative psych ROS   GI/Hepatic Neg liver ROS,GERD  ,,  Endo/Other  diabetes    Renal/GU negative Renal ROS  negative genitourinary   Musculoskeletal  (+) Arthritis ,    Abdominal   Peds negative pediatric ROS (+)  Hematology negative hematology ROS (+)   Anesthesia Other Findings Hypertension  Hyperlipidemia Wears dentures  GERD (gastroesophageal reflux disease) DM type 2 with diabetic mixed hyperlipidemia   Rheumatoid arthritis     Reproductive/Obstetrics negative OB ROS                              Anesthesia Physical Anesthesia Plan  ASA: 3  Anesthesia Plan: MAC   Post-op Pain Management:    Induction: Intravenous  PONV Risk Score and Plan:   Airway Management Planned: Natural Airway and Nasal Cannula  Additional Equipment:   Intra-op Plan:   Post-operative Plan:   Informed Consent: I have reviewed the patients History and Physical, chart, labs and discussed the procedure including the risks, benefits and alternatives for the proposed anesthesia with the patient or authorized representative who has indicated his/her understanding and acceptance.     Dental Advisory Given  Plan Discussed with: Anesthesiologist, CRNA and Surgeon  Anesthesia Plan Comments: (Patient consented for risks of  anesthesia including but not limited to:  - adverse reactions to medications - damage to eyes, teeth, lips or other oral mucosa - nerve damage due to positioning  - sore throat or hoarseness - Damage to heart, brain, nerves, lungs, other parts of body or loss of life  Patient voiced understanding and assent.)        Anesthesia Quick Evaluation

## 2023-05-22 NOTE — H&P (Signed)
[facility]   Primary Care Physician:  Danella Penton, MD Ophthalmologist: Dr. Lockie Mola  Pre-Procedure History & Physical: HPI:  Kathryn Mcdowell is a 72 y.o. female here for ophthalmic surgery.   Past Medical History:  Diagnosis Date   DM type 2 with diabetic mixed hyperlipidemia (HCC)    GERD (gastroesophageal reflux disease)    Hyperlipidemia    Hypertension    Rheumatoid arthritis (HCC)    Wears dentures    full upper and lower    Past Surgical History:  Procedure Laterality Date   COLONOSCOPY      Prior to Admission medications   Medication Sig Start Date End Date Taking? Authorizing Provider  amLODipine (NORVASC) 5 MG tablet Take 5 mg by mouth daily. 01/20/21  Yes [provider]  atorvastatin (LIPITOR) 20 MG tablet Take 10 mg by mouth at bedtime. 04/14/18  Yes [provider]  b complex vitamins capsule Take 1 capsule by mouth daily.   Yes [provider]  CINNAMON PO Take 2 tablets by mouth daily.   Yes [provider]  diphenhydrAMINE (BENADRYL) 25 mg capsule Take 25 mg by mouth every 6 (six) hours as needed.   Yes [provider]  folic acid (FOLVITE) 1 MG tablet Take 1 mg by mouth daily.   Yes [provider]  hydroxychloroquine (PLAQUENIL) 200 MG tablet Take 200 mg by mouth daily.   Yes [provider]  losartan-hydrochlorothiazide (HYZAAR) 50-12.5 MG tablet Take 1 tablet by mouth daily.   Yes [provider]  methotrexate (RHEUMATREX) 2.5 MG tablet Take 20 mg by mouth once a week. Caution:Chemotherapy. Protect from light.   Yes [provider]  Multiple Vitamin (MULTIVITAMIN) tablet Take 1 tablet by mouth daily.   Yes [provider]  Multiple Vitamins-Minerals (PRESERVISION AREDS PO) Take by mouth daily.   Yes [provider]  pantoprazole (PROTONIX) 40 MG tablet Take 1 tablet (40 mg total) by mouth daily. 02/12/21 05/15/23 Yes Zigmund Daniel., MD   TURMERIC PO Take by mouth daily.   Yes [provider]    Allergies as of 05/03/2023   (No Known Allergies)    Family History  Problem Relation Age of Onset   Breast cancer Neg Hx     Social History   Socioeconomic History   Marital status: Married    Spouse name: Not on file   Number of children: Not on file   Years of education: Not on file   Highest education level: Not on file  Occupational History   Not on file  Tobacco Use   Smoking status: Former    Current packs/day: 0.00    Average packs/day: 0.1 packs/day for 4.0 years (0.4 ttl pk-yrs)    Types: Cigarettes    Start date: 2006    Quit date: 2010    Years since quitting: 14.7   Smokeless tobacco: Never  Vaping Use   Vaping status: Never Used  Substance and Sexual Activity   Alcohol use: Yes    Alcohol/week: 7.0 standard drinks of alcohol    Types: 7 Standard drinks or equivalent per week   Drug use: Not on file   Sexual activity: Not on file  Other Topics Concern   Not on file  Social History Narrative   Not on file   Social Determinants of Health   Financial Resource Strain: Low Risk  (10/15/2022)   Received from [facility]   Overall Financial Resource  Strain (CARDIA)    Difficulty of Paying Living Expenses: Not hard at all  Food Insecurity: No Food Insecurity (10/15/2022)   Received from [facility]   Hunger Vital Sign    Worried About Running Out of Food in the Last Year: Never true    Ran Out of Food in the Last Year: Never true  Transportation Needs: No Transportation Needs (10/15/2022)   Received [facility]    In the past 12 months, has lack of transportation kept you from medical appointments or from getting medications?: No    Lack of Transportation (Non-Medical): No  Physical Activity: Not on file  Stress: Not on file  Social Connections: Not on file  Intimate Partner Violence: Not on file     Review of Systems: See HPI, otherwise negative ROS  Physical Exam: BP (!) 147/70   Pulse 81   Temp 98.5 F (36.9 C) (Temporal)   Resp 19   Ht 5' 4.02" (1.626 m)   Wt 78.3 kg   SpO2 98%   BMI 29.63 kg/m  General:   Alert,  pleasant and cooperative in NAD Head:  Normocephalic and atraumatic. Lungs:  Clear to auscultation.    Heart:  Regular rate and rhythm.   Impression/Plan: Kathryn Mcdowell is here for ophthalmic surgery.  Risks, benefits, limitations, and alternatives regarding ophthalmic surgery have been reviewed with the patient.  Questions have been answered.  All parties agreeable.   Lockie Mola, MD  05/22/2023, 10:23 AM

## 2023-05-22 NOTE — Op Note (Addendum)
OPERATIVE NOTE  Kathryn Mcdowell [id number] 05/22/2023   PREOPERATIVE DIAGNOSIS:    Nuclear Sclerotic Cataract Right eye with miotic pupil.        H25.11  POSTOPERATIVE DIAGNOSIS: Nuclear Sclerotic Cataract Right eye with miotic pupil.          PROCEDURE:  Phacoemusification with posterior chamber intraocular lens placement of the right eye which required pupil stretching with the Malyugin pupil expansion device. Ultrasound time: Procedure(s) with comments: CATARACT EXTRACTION PHACO AND INTRAOCULAR LENS PLACEMENT (IOC) RIGHT DIABETIC (Right) - 9.07 0:42.1  LENS:   Implant Name Type Inv. Item Serial No. Manufacturer Lot No. LRB No. Used Action  LENS IOL TECNIS EYHANCE 20.5 - [id number] Intraocular Lens LENS IOL TECNIS EYHANCE 20.5 [id number] [employer]  Right 1 Implanted       SURGEON:  Deirdre Evener, MD   ANESTHESIA:  Topical with tetracaine drops and 2% Xylocaine jelly, augmented with 1% preservative-free intracameral lidocaine.   COMPLICATIONS:  None.   DESCRIPTION OF PROCEDURE:  The patient was identified in the holding room and transported to the operating room and placed in the supine position under the operating microscope. The right eye was identified as the operative eye and it was prepped and draped in the usual sterile ophthalmic fashion.   A 1 millimeter clear-corneal paracentesis was made at the 12:00 position.  0.5 ml of preservative-free 1% lidocaine was injected into the anterior chamber. The anterior chamber was filled with Viscoat viscoelastic.  A 2.4 millimeter keratome was used to make a near-clear corneal incision at the 9:00 position. A Malyugin pupil expander was then placed through the main incision and into the anterior chamber of the eye.  The edge of the iris was secured on the lip of the pupil expander and it was released, thereby expanding the pupil to approximately 7 millimeters for completion of the cataract surgery.  Additional Viscoat was placed  in the anterior chamber.  A cystotome and capsulorrhexis forceps were used to make a curvilinear capsulorrhexis.   Balanced salt solution was used to hydrodissect and hydrodelineate the lens nucleus.   Phacoemulsification was used in stop and chop fashion to remove the lens, nucleus and epinucleus.  The remaining cortex was aspirated using the irrigation aspiration handpiece.  Additional Provisc was placed into the eye to distend the capsular bag for lens placement.  A lens was then injected into the capsular bag.  The pupil expanding ring was removed using a Kuglen hook and insertion device. The remaining viscoelastic was aspirated from the capsular bag and the anterior chamber.  The anterior chamber was filled with balanced salt solution to inflate to a physiologic pressure.  Wounds were hydrated with balanced salt solution.  The anterior chamber was inflated to a physiologic pressure with balanced salt solution.  No wound leaks were noted.Cefuroxime 0.1 ml of a 10mg /ml solution was injected into the anterior chamber for a dose of 1 mg of intracameral antibiotic at the completion of the case. Timolol and Brimonidine drops were applied to the eye.  The patient was taken to the recovery room in stable condition without complications of anesthesia or surgery.  [name] 05/22/2023, 12:05 PM

## 2023-05-23 ENCOUNTER — Encounter: Payer: Self-pay | Admitting: Ophthalmology

## 2023-06-03 NOTE — Discharge Instructions (Signed)

## 2023-06-05 ENCOUNTER — Encounter: Payer: Self-pay | Admitting: Ophthalmology

## 2023-06-05 ENCOUNTER — Ambulatory Visit
Admission: RE | Admit: 2023-06-05 | Discharge: 2023-06-05 | Disposition: A | Discharge status: Home / Self Care | Payer: Medicare Other | Attending: Ophthalmology | Admitting: Ophthalmology

## 2023-06-05 ENCOUNTER — Ambulatory Visit: Payer: Medicare Other | Admitting: Anesthesiology

## 2023-06-05 ENCOUNTER — Other Ambulatory Visit: Payer: Self-pay

## 2023-06-05 ENCOUNTER — Other Ambulatory Visit: Payer: Self-pay | Admitting: Internal Medicine

## 2023-06-05 ENCOUNTER — Encounter
Admission: RE | Disposition: A | Discharge status: Home / Self Care | Payer: Self-pay | Source: Home / Self Care | Attending: Ophthalmology

## 2023-06-05 DIAGNOSIS — Z6830 Body mass index (BMI) 30.0-30.9, adult: Secondary | ICD-10-CM | POA: Insufficient documentation

## 2023-06-05 DIAGNOSIS — Z87891 Personal history of nicotine dependence: Secondary | ICD-10-CM | POA: Insufficient documentation

## 2023-06-05 DIAGNOSIS — M069 Rheumatoid arthritis, unspecified: Secondary | ICD-10-CM | POA: Diagnosis not present

## 2023-06-05 DIAGNOSIS — I10 Essential (primary) hypertension: Secondary | ICD-10-CM | POA: Insufficient documentation

## 2023-06-05 DIAGNOSIS — E782 Mixed hyperlipidemia: Secondary | ICD-10-CM | POA: Diagnosis not present

## 2023-06-05 DIAGNOSIS — H2512 Age-related nuclear cataract, left eye: Secondary | ICD-10-CM | POA: Insufficient documentation

## 2023-06-05 DIAGNOSIS — Z79631 Long term (current) use of antimetabolite agent: Secondary | ICD-10-CM | POA: Insufficient documentation

## 2023-06-05 DIAGNOSIS — K219 Gastro-esophageal reflux disease without esophagitis: Secondary | ICD-10-CM | POA: Diagnosis not present

## 2023-06-05 DIAGNOSIS — H5703 Miosis: Secondary | ICD-10-CM | POA: Insufficient documentation

## 2023-06-05 DIAGNOSIS — F32A Depression, unspecified: Secondary | ICD-10-CM | POA: Diagnosis not present

## 2023-06-05 DIAGNOSIS — Z79899 Other long term (current) drug therapy: Secondary | ICD-10-CM | POA: Diagnosis not present

## 2023-06-05 DIAGNOSIS — E1136 Type 2 diabetes mellitus with diabetic cataract: Secondary | ICD-10-CM | POA: Diagnosis present

## 2023-06-05 DIAGNOSIS — E669 Obesity, unspecified: Secondary | ICD-10-CM | POA: Diagnosis not present

## 2023-06-05 DIAGNOSIS — Z1231 Encounter for screening mammogram for malignant neoplasm of breast: Secondary | ICD-10-CM

## 2023-06-05 HISTORY — PX: CATARACT EXTRACTION W/PHACO: SHX586

## 2023-06-05 SURGERY — PHACOEMULSIFICATION, CATARACT, WITH IOL INSERTION
Anesthesia: Monitor Anesthesia Care | Site: Eye | Laterality: Left

## 2023-06-05 MED ORDER — ARMC OPHTHALMIC DILATING DROPS
OPHTHALMIC | Status: AC
Start: 2023-06-05 — End: ?
  Filled 2023-06-05: qty 0.5

## 2023-06-05 MED ORDER — SIGHTPATH DOSE#1 BSS IO SOLN
INTRAOCULAR | Status: DC | PRN
  Administered 2023-06-05: 15 mL

## 2023-06-05 MED ORDER — CEFUROXIME OPHTHALMIC INJECTION 1 MG/0.1 ML
INJECTION | OPHTHALMIC | Status: DC | PRN
  Administered 2023-06-05: .1 mL via INTRACAMERAL

## 2023-06-05 MED ORDER — BRIMONIDINE TARTRATE-TIMOLOL 0.2-0.5 % OP SOLN
OPHTHALMIC | Status: DC | PRN
  Administered 2023-06-05: 1 [drp] via OPHTHALMIC

## 2023-06-05 MED ORDER — SIGHTPATH DOSE#1 NA HYALUR & NA CHOND-NA HYALUR IO KIT
PACK | INTRAOCULAR | Status: DC | PRN
  Administered 2023-06-05: 1 via OPHTHALMIC

## 2023-06-05 MED ORDER — MIDAZOLAM HCL 2 MG/2ML IJ SOLN
INTRAMUSCULAR | Status: AC
  Filled 2023-06-05: qty 2

## 2023-06-05 MED ORDER — ARMC OPHTHALMIC DILATING DROPS
1.0000 | OPHTHALMIC | Status: DC | PRN
  Administered 2023-06-05 (×3): 1 via OPHTHALMIC

## 2023-06-05 MED ORDER — TETRACAINE HCL 0.5 % OP SOLN
OPHTHALMIC | Status: AC
Start: 2023-06-05 — End: ?
  Filled 2023-06-05: qty 4

## 2023-06-05 MED ORDER — FENTANYL CITRATE (PF) 100 MCG/2ML IJ SOLN
INTRAMUSCULAR | Status: AC
  Filled 2023-06-05: qty 2

## 2023-06-05 MED ORDER — SIGHTPATH DOSE#1 BSS IO SOLN
INTRAOCULAR | Status: DC | PRN
  Administered 2023-06-05: 58 mL via OPHTHALMIC

## 2023-06-05 MED ORDER — SODIUM CHLORIDE 0.9% FLUSH: 10.0000 mL | INTRAVENOUS | Status: DC | PRN

## 2023-06-05 MED ORDER — SIGHTPATH DOSE#1 BSS IO SOLN
INTRAOCULAR | Status: DC | PRN
  Administered 2023-06-05: 1 mL

## 2023-06-05 MED ORDER — TETRACAINE HCL 0.5 % OP SOLN
1.0000 [drp] | OPHTHALMIC | Status: DC | PRN
  Administered 2023-06-05 (×3): 1 [drp] via OPHTHALMIC

## 2023-06-05 MED ORDER — MIDAZOLAM HCL 2 MG/2ML IJ SOLN
INTRAMUSCULAR | Status: DC | PRN
  Administered 2023-06-05: 2 mg via INTRAVENOUS

## 2023-06-05 MED ORDER — FENTANYL CITRATE (PF) 100 MCG/2ML IJ SOLN
INTRAMUSCULAR | Status: DC | PRN
  Administered 2023-06-05: 50 ug via INTRAVENOUS

## 2023-06-05 SURGICAL SUPPLY — 18 items
CANNULA ANT/CHMB 27G (MISCELLANEOUS) IMPLANT
CANNULA ANT/CHMB 27GA (MISCELLANEOUS)
CATARACT SUITE SIGHTPATH (MISCELLANEOUS) ×1
FEE CATARACT SUITE SIGHTPATH (MISCELLANEOUS) ×1 IMPLANT
GLOVE SRG 8 PF TXTR STRL LF DI (GLOVE) ×1 IMPLANT
GLOVE SURG ENC TEXT LTX SZ7.5 (GLOVE) ×1 IMPLANT
GLOVE SURG GAMMEX PI TX LF 7.5 (GLOVE) IMPLANT
GLOVE SURG UNDER POLY LF SZ8 (GLOVE) ×1
LENS IOL TECNIS EYHANCE 20.5 (Intraocular Lens) IMPLANT
NDL FILTER BLUNT 18X1 1/2 (NEEDLE) ×1 IMPLANT
NDL RETROBULBAR .5 NSTRL (NEEDLE) IMPLANT
NEEDLE FILTER BLUNT 18X1 1/2 (NEEDLE) ×1
PACK VIT ANT 23G (MISCELLANEOUS) IMPLANT
RING MALYGIN 7.0 (MISCELLANEOUS) IMPLANT
SUT ETHILON 10-0 CS-B-6CS-B-6 (SUTURE)
SUT VICRYL 9 0 (SUTURE) IMPLANT
SUTURE EHLN 10-0 CS-B-6CS-B-6 (SUTURE) IMPLANT
SYR 3ML LL SCALE MARK (SYRINGE) ×1 IMPLANT

## 2023-06-05 NOTE — Anesthesia Postprocedure Evaluation (Signed)
Anesthesia Post Note  Patient: Kathryn Mcdowell  Procedure(s) Performed: CATARACT EXTRACTION PHACO AND INTRAOCULAR LENS PLACEMENT (IOC) LEFT DIABETIC (Left: Eye)  Patient location during evaluation: PACU Anesthesia Type: MAC Level of consciousness: awake and alert, oriented and patient cooperative Pain management: pain level controlled Vital Signs Assessment: post-procedure vital signs reviewed and stable Respiratory status: spontaneous breathing, nonlabored ventilation and respiratory function stable Cardiovascular status: blood pressure returned to baseline and stable Postop Assessment: adequate PO intake Anesthetic complications: no   No notable events documented.   Last Vitals:  Vitals:   06/05/23 0800 06/05/23 0804  BP: 133/72 134/72  Pulse: 78 79  Resp: 17 16  Temp: 36.6 C   SpO2: 97% 96%    Last Pain:  Vitals:   06/05/23 0804  TempSrc:   PainSc: 0-No pain                 Reed Breech

## 2023-06-05 NOTE — Transfer of Care (Signed)
Immediate Anesthesia Transfer of Care Note  Patient: Kathryn Mcdowell  Procedure(s) Performed: CATARACT EXTRACTION PHACO AND INTRAOCULAR LENS PLACEMENT (IOC) LEFT DIABETIC (Left: Eye)  Patient Location: PACU  Anesthesia Type: MAC  Level of Consciousness: awake, alert  and patient cooperative  Airway and Oxygen Therapy: Patient Spontanous Breathing and Patient connected to supplemental oxygen  Post-op Assessment: Post-op Vital signs reviewed, Patient's Cardiovascular Status Stable, Respiratory Function Stable, Patent Airway and No signs of Nausea or vomiting  Post-op Vital Signs: Reviewed and stable  Complications: No notable events documented.

## 2023-06-05 NOTE — Op Note (Signed)
OPERATIVE NOTE  Kathryn Mcdowell [id number] 06/05/2023  PREOPERATIVE DIAGNOSIS:   Nuclear sclerotic cataract left eye with miotic pupil      H25.12   POSTOPERATIVE DIAGNOSIS:   Nuclear sclerotic cataract left eye with miotic pupil.     PROCEDURE:  Phacoemulsification with posterior chamber intraocular lens implantation of the left eye which required pupil stretching with the Malyugin pupil expansion device  Ultrasound time: Procedure(s) with comments: CATARACT EXTRACTION PHACO AND INTRAOCULAR LENS PLACEMENT (IOC) LEFT DIABETIC (Left) - 8.10 0:48.1  LENS:   Implant Name Type Inv. Item Serial No. Manufacturer Lot No. LRB No. Used Action  LENS IOL TECNIS EYHANCE 20.5 - [id number] Intraocular Lens LENS IOL TECNIS EYHANCE 20.5 [id number] [employer]  Left 1 Implanted       SURGEON:  Deirdre Evener, MD   ANESTHESIA: Topical with tetracaine drops and 2% Xylocaine jelly, augmented with 1% preservative-free intracameral lidocaine.   COMPLICATIONS:  None.   DESCRIPTION OF PROCEDURE:  The patient was identified in the holding room and transported to the operating room and placed in the supine position under the operating microscope.  The left eye was identified as the operative eye and it was prepped and draped in the usual sterile ophthalmic fashion.   A 1 millimeter clear-corneal paracentesis was made at the 1:30 position.  The anterior chamber was filled with Viscoat viscoelastic.  0.5 ml of preservative-free 1% lidocaine was injected into the anterior chamber.  A 2.4 millimeter keratome was used to make a near-clear corneal incision at the 10:30 position.  A Malyugin pupil expander was then placed through the main incision and into the anterior chamber of the eye.  The edge of the iris was secured on the lip of the pupil expander and it was released, thereby expanding the pupil to approximately 7 millimeters for completion of the cataract surgery.  Additional Viscoat was placed in the  anterior chamber.  A cystotome and capsulorrhexis forceps were used to make a curvilinear capsulorrhexis.   Balanced salt solution was used to hydrodissect and hydrodelineate the lens nucleus.   Phacoemulsification was used in stop and chop fashion to remove the lens, nucleus and epinucleus.  The remaining cortex was aspirated using the irrigation aspiration handpiece.  Additional Provisc was placed into the eye to distend the capsular bag for lens placement.  A lens was then injected into the capsular bag.  The pupil expanding ring was removed using a Kuglen hook and insertion device. The remaining viscoelastic was aspirated from the capsular bag and the anterior chamber.  The anterior chamber was filled with balanced salt solution to inflate to a physiologic pressure.   Wounds were hydrated with balanced salt solution.  The anterior chamber was inflated to a physiologic pressure with balanced salt solution.  No wound leaks were noted. Cefuroxime 0.1 ml of a 10mg /ml solution was injected into the anterior chamber for a dose of 1 mg of intracameral antibiotic at the completion of the case.   Timolol and Brimonidine drops were applied to the eye.  The patient was taken to the recovery room in stable condition without complications of anesthesia or surgery.  Kathryn Mcdowell 06/05/2023, 8:00 AM 2

## 2023-06-05 NOTE — H&P (Signed)
[facility]   Primary Care Physician:  Danella Penton, MD Ophthalmologist: Dr. Lockie Mola  Pre-Procedure History & Physical: HPI:  Kathryn Mcdowell is a 72 y.o. female here for ophthalmic surgery.   Past Medical History:  Diagnosis Date   DM type 2 with diabetic mixed hyperlipidemia (HCC)    GERD (gastroesophageal reflux disease)    Hyperlipidemia    Hypertension    Rheumatoid arthritis (HCC)    Wears dentures    full upper and lower    Past Surgical History:  Procedure Laterality Date   CATARACT EXTRACTION W/PHACO Right 05/22/2023   Procedure: CATARACT EXTRACTION PHACO AND INTRAOCULAR LENS PLACEMENT (IOC) RIGHT DIABETIC;  Surgeon: Lockie Mola, MD;  Location: [facility];  Service: Ophthalmology;  Laterality: Right;  9.07 0:42.1   COLONOSCOPY      Prior to Admission medications   Medication Sig Start Date End Date Taking? Authorizing Provider  amLODipine (NORVASC) 5 MG tablet Take 5 mg by mouth daily. 01/20/21  Yes [provider]  atorvastatin (LIPITOR) 20 MG tablet Take 10 mg by mouth at bedtime. 04/14/18  Yes [provider]  b complex vitamins capsule Take 1 capsule by mouth daily.   Yes [provider]  CINNAMON PO Take 2 tablets by mouth daily.   Yes [provider]  diphenhydrAMINE (BENADRYL) 25 mg capsule Take 25 mg by mouth every 6 (six) hours as needed.   Yes [provider]  folic acid (FOLVITE) 1 MG tablet Take 1 mg by mouth daily.   Yes [provider]  hydroxychloroquine (PLAQUENIL) 200 MG tablet Take 200 mg by mouth daily.   Yes [provider]  losartan-hydrochlorothiazide (HYZAAR) 50-12.5 MG tablet Take 1 tablet by mouth daily.   Yes [provider]  methotrexate (RHEUMATREX) 2.5 MG tablet Take 20 mg by mouth once a week. Caution:Chemotherapy. Protect from light.   Yes [provider]  Multiple Vitamin (MULTIVITAMIN) tablet Take 1 tablet by mouth  daily.   Yes [provider]  Multiple Vitamins-Minerals (PRESERVISION AREDS PO) Take by mouth daily.   Yes [provider]  pantoprazole (PROTONIX) 40 MG tablet Take 1 tablet (40 mg total) by mouth daily. 02/12/21 06/05/23 Yes Zigmund Daniel., MD  TURMERIC PO Take by mouth daily.   Yes [provider]    Allergies as of 05/03/2023   (No Known Allergies)    Family History  Problem Relation Age of Onset   Breast cancer Neg Hx     Social History   Socioeconomic History   Marital status: Married    Spouse name: Not on file   Number of children: Not on file   Years of education: Not on file   Highest education level: Not on file  Occupational History   Not on file  Tobacco Use   Smoking status: Former    Current packs/day: 0.00    Average packs/day: 0.1 packs/day for 4.0 years (0.4 ttl pk-yrs)    Types: Cigarettes    Start date: 2006    Quit date: 2010    Years since quitting: 14.8   Smokeless tobacco: Never  Vaping Use   Vaping status: Never Used  Substance and Sexual Activity   Alcohol use: Yes    Alcohol/week: 7.0 standard drinks of alcohol    Types: 7 Standard drinks or equivalent per week   Drug use: Not on file   Sexual activity: Not on file  Other Topics Concern   Not  on file  Social History Narrative   Not on file   Social Determinants of Health   Financial Resource Strain: Low Risk  (10/15/2022)   Received from [facility]   Overall Financial Resource Strain (CARDIA)    Difficulty of Paying Living Expenses: Not hard at all  Food Insecurity: No Food Insecurity (10/15/2022)   Received from [facility]   Hunger Vital Sign    Worried About Running Out of Food in the Last Year: Never true    Ran Out of Food in the Last Year: Never true  Transportation Needs: No Transportation Needs (10/15/2022)   Received from [facility] - Transportation    In the past 12 months, has  lack of transportation kept you from medical appointments or from getting medications?: No    Lack of Transportation (Non-Medical): No  Physical Activity: Not on file  Stress: Not on file  Social Connections: Not on file  Intimate Partner Violence: Not on file    Review of Systems: See HPI, otherwise negative ROS  Physical Exam: BP (!) 173/68   Pulse 87   Temp (!) 97.3 F (36.3 C) (Temporal)   Resp 19   Ht 5' 4.02" (1.626 m)   Wt 79.8 kg   SpO2 97%   BMI 30.19 kg/m  General:   Alert,  pleasant and cooperative in NAD Head:  Normocephalic and atraumatic. Lungs:  Clear to auscultation.    Heart:  Regular rate and rhythm.   Impression/Plan: Kathryn Mcdowell is here for ophthalmic surgery.  Risks, benefits, limitations, and alternatives regarding ophthalmic surgery have been reviewed with the patient.  Questions have been answered.  All parties agreeable.   Lockie Mola, MD  06/05/2023, 7:31 AM

## 2023-06-05 NOTE — Anesthesia Preprocedure Evaluation (Addendum)
Anesthesia Evaluation  Patient identified by MRN, date of birth, ID band Patient awake    Reviewed: Allergy & Precautions, NPO status , Patient's Chart, lab work & pertinent test results  History of Anesthesia Complications Negative for: history of anesthetic complications  Airway Mallampati: IV   Neck ROM: Full    Dental  (+) Upper Dentures, Lower Dentures   Pulmonary former smoker (quit 2010)   Pulmonary exam normal breath sounds clear to auscultation       Cardiovascular hypertension, Normal cardiovascular exam Rhythm:Regular Rate:Normal     Neuro/Psych  PSYCHIATRIC DISORDERS  Depression    negative neurological ROS     GI/Hepatic ,GERD  ,,  Endo/Other  diabetes, Type 2  Obesity   Renal/GU negative Renal ROS     Musculoskeletal  (+) Arthritis , Rheumatoid disorders,    Abdominal   Peds  Hematology negative hematology ROS (+)   Anesthesia Other Findings   Reproductive/Obstetrics                             Anesthesia Physical Anesthesia Plan  ASA: 2  Anesthesia Plan: MAC   Post-op Pain Management:    Induction: Intravenous  PONV Risk Score and Plan: 2 and Treatment may vary due to age or medical condition, Midazolam and TIVA  Airway Management Planned: Natural Airway and Nasal Cannula  Additional Equipment:   Intra-op Plan:   Post-operative Plan:   Informed Consent: I have reviewed the patients History and Physical, chart, labs and discussed the procedure including the risks, benefits and alternatives for the proposed anesthesia with the patient or authorized representative who has indicated his/her understanding and acceptance.     Dental advisory given  Plan Discussed with: CRNA  Anesthesia Plan Comments: (LMA/GETA backup discussed.  Patient consented for risks of anesthesia including but not limited to:  - adverse reactions to medications - damage to eyes,  teeth, lips or other oral mucosa - nerve damage due to positioning  - sore throat or hoarseness - damage to heart, brain, nerves, lungs, other parts of body or loss of life  Informed patient about role of CRNA in peri- and intra-operative care.  Patient voiced understanding.)        Anesthesia Quick Evaluation

## 2023-06-06 ENCOUNTER — Encounter: Payer: Self-pay | Admitting: Ophthalmology

## 2023-07-09 ENCOUNTER — Ambulatory Visit
Admission: RE | Admit: 2023-07-09 | Discharge: 2023-07-09 | Disposition: A | Discharge status: Home / Self Care | Payer: Medicare Other | Source: Ambulatory Visit | Attending: Internal Medicine | Admitting: Internal Medicine

## 2023-07-09 DIAGNOSIS — Z1231 Encounter for screening mammogram for malignant neoplasm of breast: Secondary | ICD-10-CM | POA: Diagnosis present

## 2024-03-13 ENCOUNTER — Other Ambulatory Visit: Payer: Self-pay | Admitting: Internal Medicine

## 2024-06-03 ENCOUNTER — Other Ambulatory Visit: Payer: Self-pay | Admitting: Internal Medicine

## 2024-06-03 DIAGNOSIS — Z1231 Encounter for screening mammogram for malignant neoplasm of breast: Secondary | ICD-10-CM

## 2024-07-13 ENCOUNTER — Ambulatory Visit
Admission: RE | Admit: 2024-07-13 | Discharge: 2024-07-13 | Disposition: A | Source: Ambulatory Visit | Attending: Internal Medicine | Admitting: Internal Medicine

## 2024-07-13 DIAGNOSIS — Z1231 Encounter for screening mammogram for malignant neoplasm of breast: Secondary | ICD-10-CM | POA: Diagnosis present
